# Patient Record
Sex: Male | Born: 1955
Health system: Southern US, Community
[De-identification: ages and names within clinical notes are randomized; demographics above are authoritative.]

## PROBLEM LIST (undated history)

## (undated) DIAGNOSIS — R350 Frequency of micturition: Secondary | ICD-10-CM

## (undated) DIAGNOSIS — I10 Essential (primary) hypertension: Secondary | ICD-10-CM

## (undated) DIAGNOSIS — R42 Dizziness and giddiness: Secondary | ICD-10-CM

## (undated) HISTORY — DX: Frequency of micturition: R35.0

## (undated) HISTORY — PX: HERNIA REPAIR: SHX51

## (undated) HISTORY — DX: Dizziness and giddiness: R42

---

## 2006-09-15 ENCOUNTER — Emergency Department (HOSPITAL_COMMUNITY): Admission: EM | Admit: 2006-09-15 | Discharge: 2006-09-15 | Payer: Self-pay | Admitting: Family Medicine

## 2007-01-09 ENCOUNTER — Emergency Department (HOSPITAL_COMMUNITY): Admission: EM | Admit: 2007-01-09 | Discharge: 2007-01-09 | Payer: Self-pay | Admitting: Family Medicine

## 2010-07-31 ENCOUNTER — Ambulatory Visit (HOSPITAL_COMMUNITY): Admission: RE | Admit: 2010-07-31 | Discharge: 2010-07-31 | Payer: Self-pay | Admitting: Gastroenterology

## 2015-01-27 ENCOUNTER — Emergency Department
Admission: EM | Admit: 2015-01-27 | Discharge: 2015-01-27 | Disposition: A | Discharge status: Home / Self Care | Payer: 59 | Source: Home / Self Care | Attending: Family Medicine | Admitting: Family Medicine

## 2015-01-27 DIAGNOSIS — H65191 Other acute nonsuppurative otitis media, right ear: Secondary | ICD-10-CM

## 2015-01-27 DIAGNOSIS — J069 Acute upper respiratory infection, unspecified: Secondary | ICD-10-CM | POA: Diagnosis not present

## 2015-01-27 LAB — POCT RAPID STREP A (OFFICE): RAPID STREP A SCREEN: NEGATIVE

## 2015-01-27 MED ORDER — AZITHROMYCIN 250 MG PO TABS: ORAL_TABLET | ORAL | Status: DC

## 2015-01-27 MED ORDER — BENZONATATE 200 MG PO CAPS: 200.0000 mg | ORAL_CAPSULE | Freq: Every day | ORAL | Status: DC

## 2015-01-27 NOTE — ED Provider Notes (Signed)
CSN: [id number]     Arrival date & time 01/27/15  1133 History   First MD Initiated Contact with Patient 01/27/15 1231     Chief Complaint  Patient presents with  . Sore Throat      HPI Comments: Patient complains of one week history of typical cold-like symptoms including mild sore throat, sinus congestion, fatigue, and cough. His ears feel full with decreased hearing on the right.  He states that he has had several year history of recurring disequilibrium, worse with present illness.  The history is provided by the patient and the spouse.    No past medical history on file. No past surgical history on file. No family history on file. History  Substance Use Topics  . Smoking status: Not on file  . Smokeless tobacco: Not on file  . Alcohol Use: Not on file    Review of Systems + sore throat + hoarseness + cough No pleuritic pain No wheezing + nasal congestion + post-nasal drainage No sinus pain/pressure No itchy/red eyes ? Earache and change in hearing + dizziness No hemoptysis No SOB + low grade fever, + chills No nausea No vomiting No abdominal pain No diarrhea No urinary symptoms No skin rash + fatigue No myalgias No headache Used OTC meds without relief  Allergies  Review of patient's allergies indicates no known allergies.  Home Medications   Prior to Admission medications   Medication Sig Start Date End Date Taking? Authorizing Provider  BENAZEPRIL HCL PO Take by mouth.   Yes Historical Provider, MD  azithromycin (ZITHROMAX Z-PAK) 250 MG tablet Take 2 tabs today; then begin one tab once daily for 4 more days. 01/27/15   Lattie Haw, MD  benzonatate (TESSALON) 200 MG capsule Take 1 capsule (200 mg total) by mouth at bedtime. Take as needed for cough 01/27/15   Lattie Haw, MD   BP 128/70 mmHg  Pulse 92  Temp(Src) 97.4 F (36.3 C) (Oral)  Wt 256 lb (116.121 kg)  SpO2 97% Physical Exam Nursing notes and Vital Signs reviewed. Appearance:   Patient appears stated age, and in no acute distress Eyes:  Pupils are equal, round, and reactive to light and accomodation.  Extraocular movement is intact.  Conjunctivae are not inflamed  Ears:  Canals normal.  Left tympanic membrane is normal.  Right tympanic membrane is scarred, retracted, and erythematousl  Nose:  Mildly congested turbinates.  No sinus tenderness.    Pharynx:  Normal Neck:  Supple.  Slightly tender enlarged posterior nodes are palpated bilaterally  Lungs:  Clear to auscultation.  Breath sounds are equal.  Heart:  Regular rate and rhythm without murmurs, rubs, or gallops.  Abdomen:  Nontender without masses or hepatosplenomegaly.  Bowel sounds are present.  No CVA or flank tenderness.  Extremities:  No edema.  No calf tenderness Skin:  No rash present.   ED Course  Procedures  None    Labs Reviewed  POCT RAPID STREP A (OFFICE) - Negative   Tympanogram:  Wide in left ear; "noisy" right ear.       MDM   1. Acute nonsuppurative otitis media of right ear   2. Acute upper respiratory infection    Begin Z-pack for atypical coverage.  Prescription written for Benzonatate [facility]) to take at bedtime for night-time cough.  Take plain guaifenesin (  extended release tabs such as Mucinex) twice daily, with plenty of water, for cough and congestion.   Get adequate rest.   May use Afrin  nasal spray (or generic oxymetazoline) twice daily for about 5 days.  Also recommend using saline nasal spray several times daily and saline nasal irrigation (AYR is a common brand)   Try warm salt water gargles for sore throat.  Stop all antihistamines for now, and other non-prescription cough/cold preparations.   Follow-up with family doctor if not improving about10 days.  Follow-up with ENT for evaluation of recurring disequilibrium and abnormal right tympanogram.   Lattie HawStephen A Merit Maybee, MD 01/27/15 [phone]

## 2015-01-27 NOTE — Discharge Instructions (Signed)
Take plain guaifenesin (1200mg  extended release tabs such as Mucinex) twice daily, with plenty of water, for cough and congestion.   Get adequate rest.   May use Afrin nasal spray (or generic oxymetazoline) twice daily for about 5 days.  Also recommend using saline nasal spray several times daily and saline nasal irrigation (AYR is a common brand)   Try warm salt water gargles for sore throat.  Stop all antihistamines for now, and other non-prescription cough/cold preparations.   Follow-up with family doctor if not improving about10 days.

## 2015-01-27 NOTE — ED Notes (Signed)
Sore throat, cough , congestion, facial pain and pressure, eye drainage 2-3 days

## 2016-02-28 MED FILL — BENAZEPRIL HCL 20 MG TABLET: 20 | 90 days supply | Qty: 90 | Fill #1

## 2016-03-19 DIAGNOSIS — H02834 Dermatochalasis of left upper eyelid: Secondary | ICD-10-CM | POA: Diagnosis not present

## 2016-03-19 DIAGNOSIS — H10523 Angular blepharoconjunctivitis, bilateral: Secondary | ICD-10-CM | POA: Diagnosis not present

## 2016-03-19 DIAGNOSIS — H02055 Trichiasis without entropian left lower eyelid: Secondary | ICD-10-CM | POA: Diagnosis not present

## 2016-03-19 DIAGNOSIS — H02831 Dermatochalasis of right upper eyelid: Secondary | ICD-10-CM | POA: Diagnosis not present

## 2016-03-19 DIAGNOSIS — H43393 Other vitreous opacities, bilateral: Secondary | ICD-10-CM | POA: Diagnosis not present

## 2016-03-20 DIAGNOSIS — Z0000 Encounter for general adult medical examination without abnormal findings: Secondary | ICD-10-CM | POA: Diagnosis not present

## 2016-03-26 DIAGNOSIS — Z0000 Encounter for general adult medical examination without abnormal findings: Secondary | ICD-10-CM | POA: Diagnosis not present

## 2016-05-26 ENCOUNTER — Ambulatory Visit (HOSPITAL_COMMUNITY)
Admission: EM | Admit: 2016-05-26 | Discharge: 2016-05-26 | Disposition: A | Discharge status: Home / Self Care | Payer: 59 | Attending: Family Medicine | Admitting: Family Medicine

## 2016-05-26 ENCOUNTER — Encounter (HOSPITAL_COMMUNITY): Payer: Self-pay | Admitting: Emergency Medicine

## 2016-05-26 DIAGNOSIS — H8110 Benign paroxysmal vertigo, unspecified ear: Secondary | ICD-10-CM | POA: Diagnosis not present

## 2016-05-26 HISTORY — DX: Essential (primary) hypertension: I10

## 2016-05-26 LAB — POCT I-STAT, CHEM 8
BUN: 22 mg/dL — ABNORMAL HIGH (ref 6–20)
CHLORIDE: 103 mmol/L (ref 101–111)
CREATININE: 1.1 mg/dL (ref 0.61–1.24)
Calcium, Ion: 1.1 mmol/L — ABNORMAL LOW (ref 1.13–1.30)
GLUCOSE: 97 mg/dL (ref 65–99)
HCT: 46 % (ref 39.0–52.0)
HEMOGLOBIN: 15.6 g/dL (ref 13.0–17.0)
POTASSIUM: 4.1 mmol/L (ref 3.5–5.1)
Sodium: 140 mmol/L (ref 135–145)
TCO2: 27 mmol/L (ref 0–100)

## 2016-05-26 MED ORDER — MECLIZINE HCL 25 MG PO TABS: 25.0000 mg | ORAL_TABLET | Freq: Three times a day (TID) | ORAL | Status: DC | PRN

## 2016-05-26 NOTE — ED Provider Notes (Signed)
CSN: [id number]     Arrival date & time 05/26/16  1458 History   First MD Initiated Contact with Patient 05/26/16 1539     Chief Complaint  Patient presents with  . Anxiety  . Dizziness   (Consider location/radiation/quality/duration/timing/severity/associated sxs/prior Treatment) Patient is a 60 y.o. male presenting with anxiety. The history is provided by the patient and the spouse.  Anxiety This is a new problem. The problem has been resolved. Pertinent negatives include no chest pain, no abdominal pain and no headaches.    Past Medical History  Diagnosis Date  . Hypertension    Past Surgical History  Procedure Laterality Date  . Hernia repair     Family History  Problem Relation Age of Onset  . Stroke Mother   . Hypertension Mother   . Heart failure Father    Social History  Substance Use Topics  . Smoking status: Never Smoker   . Smokeless tobacco: None  . Alcohol Use: Yes     Comment: occassional    Review of Systems  Constitutional: Negative.   HENT: Negative.  Negative for hearing loss.   Eyes: Negative.   Respiratory: Negative.   Cardiovascular: Negative.  Negative for chest pain.  Gastrointestinal: Negative for nausea, vomiting and abdominal pain.  Genitourinary: Negative.   Neurological: Positive for dizziness. Negative for facial asymmetry, light-headedness and headaches.  All other systems reviewed and are negative.   Allergies  Review of patient's allergies indicates no known allergies.  Home Medications   Prior to Admission medications   Medication Sig Start Date End Date Taking? Authorizing Provider  BENAZEPRIL HCL PO Take by mouth.   Yes Historical Provider, MD  azithromycin (ZITHROMAX Z-PAK) 250 MG tablet Take 2 tabs today; then begin one tab once daily for 4 more days. 01/27/15   Lattie HawStephen A Beese, MD  benzonatate (TESSALON) 200 MG capsule Take 1 capsule (200 mg total) by mouth at bedtime. Take as needed for cough 01/27/15   Lattie HawStephen A Beese, MD   meclizine (ANTIVERT) 25 MG tablet Take 1 tablet (25 mg total) by mouth 3 (three) times daily as needed for dizziness. 05/26/16   Linna HoffJames D Salome Cozby, MD   Meds Ordered and Administered this Visit  Medications - No data to display  BP 162/95 mmHg  Pulse 60  Temp(Src) 97.5 F (36.4 C) (Oral)  Resp 18  SpO2 100% No data found.   Physical Exam  Constitutional: He is oriented to person, place, and time. He appears well-developed and well-nourished. No distress.  Neck: Normal range of motion. Neck supple.  Cardiovascular: Normal rate, regular rhythm, normal heart sounds and intact distal pulses.   Pulmonary/Chest: Effort normal and breath sounds normal.  Abdominal: Soft. Bowel sounds are normal.  Lymphadenopathy:    He has no cervical adenopathy.  Neurological: He is alert and oriented to person, place, and time. No cranial nerve deficit. Coordination normal.  Skin: Skin is warm and dry.  Nursing note and vitals reviewed.   ED Course  Procedures (including critical care time)  Labs Review Labs Reviewed  POCT I-STAT, CHEM 8 - Abnormal; Notable for the following:    BUN 22 (*)    Calcium, Ion 1.10 (*)    All other components within normal limits    Imaging Review No results found.   Visual Acuity Review  Right Eye Distance:   Left Eye Distance:   Bilateral Distance:    Right Eye Near:   Left Eye Near:    Bilateral Near:  MDM   1. BPV (benign positional vertigo), unspecified laterality        Linna HoffJames D Ozzie Knobel, MD 05/26/16 [phone]

## 2016-05-26 NOTE — Discharge Instructions (Signed)
See dr bates for further eval, use medicine as prescribed.

## 2016-05-26 NOTE — ED Notes (Signed)
[facility] a complaint of a panic attack that occurred today and dizziness that occurs when he changes positions.

## 2016-06-04 DIAGNOSIS — R42 Dizziness and giddiness: Secondary | ICD-10-CM | POA: Diagnosis not present

## 2016-06-04 DIAGNOSIS — H8110 Benign paroxysmal vertigo, unspecified ear: Secondary | ICD-10-CM | POA: Diagnosis not present

## 2016-06-04 DIAGNOSIS — I10 Essential (primary) hypertension: Secondary | ICD-10-CM | POA: Diagnosis not present

## 2016-06-09 DIAGNOSIS — R42 Dizziness and giddiness: Secondary | ICD-10-CM | POA: Diagnosis not present

## 2016-06-09 MED FILL — AMLODIPINE BESYLATE 5 MG TA: 5 | 30 days supply | Qty: 30 | Fill #0

## 2016-07-07 MED FILL — AMLODIPINE BESYLATE 5 MG TA: 5 | 30 days supply | Qty: 30 | Fill #0

## 2016-08-08 MED FILL — AMLODIPINE BESYLATE 5 MG TA: 5 | 30 days supply | Qty: 30 | Fill #1

## 2016-09-09 MED FILL — AMLODIPINE BESYLATE 5 MG TA: 5 | 30 days supply | Qty: 30 | Fill #1

## 2016-09-22 DIAGNOSIS — I10 Essential (primary) hypertension: Secondary | ICD-10-CM | POA: Diagnosis not present

## 2016-10-15 MED FILL — AMLODIPINE BESYLATE 5 MG TA: 5 | 90 days supply | Qty: 90 | Fill #0

## 2017-02-05 MED FILL — AMLODIPINE BESYLATE 5 MG TA: 5 | 90 days supply | Qty: 90 | Fill #1

## 2017-03-11 DIAGNOSIS — I10 Essential (primary) hypertension: Secondary | ICD-10-CM | POA: Diagnosis not present

## 2017-03-11 DIAGNOSIS — R42 Dizziness and giddiness: Secondary | ICD-10-CM | POA: Diagnosis not present

## 2017-03-11 MED FILL — MECLIZINE 25 MG TABLET: 25 | 30 days supply | Qty: 30 | Fill #0

## 2017-05-27 MED FILL — AMLODIPINE BESYLATE 5 MG TA: 5 | 30 days supply | Qty: 30 | Fill #0

## 2017-07-08 MED FILL — AMLODIPINE BESYLATE 5 MG TA: 5 | 90 days supply | Qty: 90 | Fill #0

## 2017-09-10 DIAGNOSIS — R351 Nocturia: Secondary | ICD-10-CM | POA: Diagnosis not present

## 2017-09-10 DIAGNOSIS — E7800 Pure hypercholesterolemia, unspecified: Secondary | ICD-10-CM | POA: Diagnosis not present

## 2017-09-10 DIAGNOSIS — Z0001 Encounter for general adult medical examination with abnormal findings: Secondary | ICD-10-CM | POA: Diagnosis not present

## 2017-09-10 DIAGNOSIS — I10 Essential (primary) hypertension: Secondary | ICD-10-CM | POA: Diagnosis not present

## 2017-09-10 MED FILL — TAMSULOSIN HCL 0.4 MG CAP: 0.4 | 30 days supply | Qty: 30 | Fill #0

## 2017-10-20 MED FILL — TAMSULOSIN HCL 0.4 MG CAP: 0.4 | 30 days supply | Qty: 30 | Fill #1

## 2017-10-20 MED FILL — AMLODIPINE BESYLATE 5 MG TA: 5 | 90 days supply | Qty: 90 | Fill #1

## 2017-12-30 MED FILL — TAMSULOSIN HCL 0.4 MG CAP: 0.4 | 30 days supply | Qty: 30 | Fill #2

## 2018-02-01 MED FILL — AMLODIPINE BESYLATE 5 MG TA: 5 | 90 days supply | Qty: 90 | Fill #0

## 2018-02-18 MED FILL — TAMSULOSIN HCL 0.4 MG CAP: 0.4 | 90 days supply | Qty: 90 | Fill #0

## 2018-03-18 ENCOUNTER — Encounter (INDEPENDENT_AMBULATORY_CARE_PROVIDER_SITE_OTHER): Payer: Self-pay

## 2018-04-05 ENCOUNTER — Encounter (INDEPENDENT_AMBULATORY_CARE_PROVIDER_SITE_OTHER): Payer: Self-pay | Admitting: Family Medicine

## 2018-04-05 ENCOUNTER — Ambulatory Visit (INDEPENDENT_AMBULATORY_CARE_PROVIDER_SITE_OTHER): Payer: 59 | Admitting: Family Medicine

## 2018-04-05 VITALS — BP 124/77 | HR 65 | Temp 97.5°F | Ht 73.0 in | Wt 243.0 lb

## 2018-04-05 DIAGNOSIS — Z9189 Other specified personal risk factors, not elsewhere classified: Secondary | ICD-10-CM | POA: Diagnosis not present

## 2018-04-05 DIAGNOSIS — E66811 Obesity, class 1: Secondary | ICD-10-CM

## 2018-04-05 DIAGNOSIS — Z0289 Encounter for other administrative examinations: Secondary | ICD-10-CM

## 2018-04-05 DIAGNOSIS — R5383 Other fatigue: Secondary | ICD-10-CM

## 2018-04-05 DIAGNOSIS — Z1331 Encounter for screening for depression: Secondary | ICD-10-CM

## 2018-04-05 DIAGNOSIS — Z6832 Body mass index (BMI) 32.0-32.9, adult: Secondary | ICD-10-CM | POA: Diagnosis not present

## 2018-04-05 DIAGNOSIS — I10 Essential (primary) hypertension: Secondary | ICD-10-CM | POA: Diagnosis not present

## 2018-04-05 DIAGNOSIS — E669 Obesity, unspecified: Secondary | ICD-10-CM | POA: Diagnosis not present

## 2018-04-05 DIAGNOSIS — R7303 Prediabetes: Secondary | ICD-10-CM | POA: Diagnosis not present

## 2018-04-06 LAB — CBC WITH DIFFERENTIAL
BASOS ABS: 0.1 10*3/uL (ref 0.0–0.2)
Basos: 1 %
EOS (ABSOLUTE): 0.1 10*3/uL (ref 0.0–0.4)
Eos: 1 %
Hematocrit: 47.7 % (ref 37.5–51.0)
Hemoglobin: 15.6 g/dL (ref 13.0–17.7)
Immature Grans (Abs): 0 10*3/uL (ref 0.0–0.1)
Immature Granulocytes: 0 %
LYMPHS ABS: 1.3 10*3/uL (ref 0.7–3.1)
Lymphs: 19 %
MCH: 29.8 pg (ref 26.6–33.0)
MCHC: 32.7 g/dL (ref 31.5–35.7)
MCV: 91 fL (ref 79–97)
MONOS ABS: 0.6 10*3/uL (ref 0.1–0.9)
Monocytes: 9 %
NEUTROS ABS: 4.6 10*3/uL (ref 1.4–7.0)
NEUTROS PCT: 70 %
RBC: 5.24 x10E6/uL (ref 4.14–5.80)
RDW: 13.3 % (ref 12.3–15.4)
WBC: 6.5 10*3/uL (ref 3.4–10.8)

## 2018-04-06 LAB — HEMOGLOBIN A1C
Est. average glucose Bld gHb Est-mCnc: 108 mg/dL
HEMOGLOBIN A1C: 5.4 % (ref 4.8–5.6)

## 2018-04-06 LAB — COMPREHENSIVE METABOLIC PANEL
A/G RATIO: 2 (ref 1.2–2.2)
ALT: 12 IU/L (ref 0–44)
AST: 21 IU/L (ref 0–40)
Albumin: 4.3 g/dL (ref 3.6–4.8)
Alkaline Phosphatase: 65 IU/L (ref 39–117)
BUN / CREAT RATIO: 13 (ref 10–24)
BUN: 14 mg/dL (ref 8–27)
Bilirubin Total: 0.4 mg/dL (ref 0.0–1.2)
CALCIUM: 8.9 mg/dL (ref 8.6–10.2)
CO2: 22 mmol/L (ref 20–29)
Chloride: 102 mmol/L (ref 96–106)
Creatinine, Ser: 1.07 mg/dL (ref 0.76–1.27)
GFR, EST AFRICAN AMERICAN: 86 mL/min/{1.73_m2} (ref >59)
GFR, EST NON AFRICAN AMERICAN: 74 mL/min/{1.73_m2} (ref >59)
GLOBULIN, TOTAL: 2.1 g/dL (ref 1.5–4.5)
Glucose: 97 mg/dL (ref 65–99)
POTASSIUM: 4.2 mmol/L (ref 3.5–5.2)
SODIUM: 139 mmol/L (ref 134–144)
TOTAL PROTEIN: 6.4 g/dL (ref 6.0–8.5)

## 2018-04-06 LAB — LIPID PANEL WITH LDL/HDL RATIO
CHOLESTEROL TOTAL: 176 mg/dL (ref 100–199)
HDL: 53 mg/dL (ref >39)
LDL CALC: 104 mg/dL — AB (ref 0–99)
LDL/HDL RATIO: 2 ratio (ref 0.0–3.6)
TRIGLYCERIDES: 95 mg/dL (ref 0–149)
VLDL CHOLESTEROL CAL: 19 mg/dL (ref 5–40)

## 2018-04-06 LAB — T4, FREE: FREE T4: 1.63 ng/dL (ref 0.82–1.77)

## 2018-04-06 LAB — T3: T3, Total: 80 ng/dL (ref 71–180)

## 2018-04-06 LAB — VITAMIN D 25 HYDROXY (VIT D DEFICIENCY, FRACTURES): Vit D, 25-Hydroxy: 36.9 ng/mL (ref 30.0–100.0)

## 2018-04-06 LAB — VITAMIN B12: Vitamin B-12: 646 pg/mL (ref 232–1245)

## 2018-04-06 LAB — FOLATE

## 2018-04-06 LAB — INSULIN, RANDOM: INSULIN: 9.7 u[IU]/mL (ref 2.6–24.9)

## 2018-04-06 LAB — TSH: TSH: 1.14 u[IU]/mL (ref 0.450–4.500)

## 2018-04-12 NOTE — Progress Notes (Signed)
.  Office: [phone]  /  Fax: [phone]   HPI:   Chief Complaint: OBESITY  Travis Meyer (MR# [id number]) is a 62 y.o. male who presents on 04/05/2018 for obesity evaluation and treatment. Current BMI is Body mass index is 32.06 kg/m.[name] Travis Meyer has struggled with obesity for years and has been unsuccessful in either losing weight or maintaining long term weight loss. Travis Meyer attended our information session and states he is currently in the action stage of change and ready to dedicate time achieving and maintaining a healthier weight.  Travis Meyer hears about our clinic from his wife. Significant snacking throughout the day.   Travis Meyer states his family eats meals together he thinks his family will eat healthier with  him he struggles with family and or coworkers weight loss sabotage his desired weight loss is 49 lbs he started gaining weight around age 7 his heaviest weight ever was 260 lbs he is a picky eater and doesn't like to eat healthier foods  he has significant food cravings issues  he snacks frequently in the evenings he is trying to eat vegetarian he is trying to eat vegan he is frequently drinking liquids with calories he frequently makes poor food choices he frequently eats larger portions than normal  he struggles with emotional eating    Fatigue Travis Meyer feels his energy is lower than it should Meyer. This has worsened with weight gain and has not worsened recently. Travis Meyer admits to daytime somnolence and  denies waking up still tired. Travis Meyer is at risk for obstructive sleep apnea. Patent has a history of symptoms of daytime fatigue. Travis Meyer generally gets 6 hours of sleep per night, and states they generally have generally restful sleep. Snoring is present. Apneic episodes are not present. Epworth Sleepiness Score is 5. EKG within normal limits.  Pre-Diabetes Travis Meyer has a diagnosis of pre-diabetes based on his elevated Hgb A1c and was informed this puts him at greater risk of developing  diabetes. He was told this was a diagnosis years ago. He is not taking metformin currently and continues to work on diet and exercise to decrease risk of diabetes. He denies nausea or hypoglycemia.  Hypertension Travis Meyer is a 62 y.o. male with hypertension. Travis Meyer denies chest pain, chest pressure, or headache. He is working weight loss to help control his blood pressure with the goal of decreasing his risk of heart attack and stroke. Travis Meyer's blood pressure is currently controlled.  At risk for cardiovascular disease Travis Meyer is at a higher than average risk for cardiovascular disease due to obesity and hypertension. He currently denies any chest pain.  Depression Screen Travis Meyer's Food and Mood (modified PHQ-9) score was  Depression screen PHQ 2/9 04/05/2018  Decreased Interest 1  Down, Depressed, Hopeless 0  PHQ - 2 Score 1  Altered sleeping 0  Tired, decreased energy 1  Change in appetite 0  Feeling bad or failure about yourself  0  Trouble concentrating 1  Moving slowly or fidgety/restless 0  Suicidal thoughts 0  PHQ-9 Score 3  Difficult doing work/chores Not difficult at all    ALLERGIES: No Known Allergies  MEDICATIONS: Current Outpatient Medications on File Prior to Visit  Medication Sig Dispense Refill  . amLODipine (NORVASC) 5 MG tablet Take 5 mg by mouth daily.    . Multiple Vitamin (MULTIVITAMIN) tablet Take 1 tablet by mouth daily.    . Omega-3 Fatty Acids (FISH OIL) 1000 MG CAPS Take by mouth daily.    . tamsulosin (FLOMAX) 0.4  MG CAPS capsule Take 0.4 mg by mouth.    . vitamin C (ASCORBIC ACID) 500 MG tablet Take 500 mg by mouth daily.    . vitamin E 100 UNIT capsule Take by mouth daily.    [name] zinc sulfate 220 (50 Zn) MG capsule Take 220 mg by mouth daily.     No current facility-administered medications on file prior to visit.     PAST MEDICAL HISTORY: Past Medical History:  Diagnosis Date  . Dizziness   . Frequent urination   . Hypertension     PAST SURGICAL  HISTORY: Past Surgical History:  Procedure Laterality Date  . HERNIA REPAIR      SOCIAL HISTORY: Social History   Tobacco Use  . Smoking status: Never Smoker  . Smokeless tobacco: Never Used  Substance Use Topics  . Alcohol use: Yes    Comment: occassional  . Drug use: Never    FAMILY HISTORY: Family History  Problem Relation Age of Onset  . Stroke Mother   . Hypertension Mother   . Heart failure Father   . Heart disease Father   . Sudden death Father   . Alcoholism Father   . Obesity Father     ROS: Review of Systems  Constitutional: Positive for malaise/fatigue. Negative for weight loss.  Eyes:       + Wear glasses or contacts  Cardiovascular: Negative for chest pain.       Negative chest pressure  Gastrointestinal: Negative for nausea.  Genitourinary: Positive for frequency.  Neurological: Positive for dizziness. Negative for headaches.  Endo/Heme/Allergies:       Negative hypoglycemia    PHYSICAL EXAM: Blood pressure 124/77, pulse 65, temperature (!) 97.5 F (36.4 C), temperature source Oral, height  (1.854 m), weight 243 lb (110.2 kg), SpO2 98 %. Body mass index is 32.06 kg/m. Physical Exam  Constitutional: He is oriented to person, place, and time. He appears well-developed and well-nourished.  HENT:  Head: Normocephalic and atraumatic.  Eyes: EOM are normal. No scleral icterus.  Neck: Normal range of motion. Neck supple. No thyromegaly present.  Cardiovascular: Normal rate and regular rhythm.  Pulmonary/Chest: Effort normal. No respiratory distress.  Abdominal: Soft. There is no tenderness.  + Obesity  Musculoskeletal:  Range of Motion normal in all 4 extremities Trace edema noted in bilateral lower extremities  Neurological: He is alert and oriented to person, place, and time. Coordination normal.  Skin: Skin is warm and dry.  Psychiatric: He has a normal mood and affect. His behavior is normal.  Vitals reviewed.   RECENT LABS AND  TESTS: BMET    Component Value Date/Time   NA 139 04/05/2018 1118   K 4.2 04/05/2018 1118   CL 102 04/05/2018 1118   CO2 22 04/05/2018 1118   GLUCOSE 97 04/05/2018 1118   GLUCOSE 97 05/26/2016 1630   BUN 14 04/05/2018 1118   CREATININE 1.07 04/05/2018 1118   CALCIUM 8.9 04/05/2018 1118   GFRNONAA 74 04/05/2018 1118   GFRAA 86 04/05/2018 1118   Lab Results  Component Value Date   HGBA1C 5.4 04/05/2018   Lab Results  Component Value Date   INSULIN 9.7 04/05/2018   CBC    Component Value Date/Time   WBC 6.5 04/05/2018 1118   RBC 5.24 04/05/2018 1118   HGB 15.6 04/05/2018 1118   HCT 47.7 04/05/2018 1118   MCV 91 04/05/2018 1118   MCH 29.8 04/05/2018 1118   MCHC 32.7 04/05/2018 1118   RDW 13.3  04/05/2018 1118   LYMPHSABS 1.3 04/05/2018 1118   EOSABS 0.1 04/05/2018 1118   BASOSABS 0.1 04/05/2018 1118   Iron/TIBC/Ferritin/ %Sat No results found for: IRON, TIBC, FERRITIN, IRONPCTSAT Lipid Panel     Component Value Date/Time   CHOL 176 04/05/2018 1118   TRIG 95 04/05/2018 1118   HDL 53 04/05/2018 1118   LDLCALC 104 (H) 04/05/2018 1118   Hepatic Function Panel     Component Value Date/Time   PROT 6.4 04/05/2018 1118   ALBUMIN 4.3 04/05/2018 1118   AST 21 04/05/2018 1118   ALT 12 04/05/2018 1118   ALKPHOS 65 04/05/2018 1118   BILITOT 0.4 04/05/2018 1118      Component Value Date/Time   TSH 1.140 04/05/2018 1118   Vitamin D No recent labs  ECG  shows NSR with a rate of 69 BPM INDIRECT CALORIMETER done today shows a VO2 of 301 and a REE of 2096. His calculated basal metabolic rate is 1610 thus his basal metabolic rate is worse than expected.    ASSESSMENT AND PLAN: Other fatigue - Plan: EKG 12-Lead, Vitamin B12, Folate, T3, T4, free, TSH, VITAMIN D 25 Hydroxy (Vit-D Deficiency, Fractures)  Shortness of breath on exertion  Prediabetes - Plan: Hemoglobin A1c, Insulin, random  Essential hypertension - Plan: Comprehensive metabolic panel, CBC With  Differential, Lipid Panel With LDL/HDL Ratio  Depression screening  At risk for heart disease  Class 1 obesity with serious comorbidity and body mass index (BMI) of 32.0 to 32.9 in adult, unspecified obesity type  PLAN:  Fatigue Travis Meyer was informed that his fatigue may Meyer related to obesity, depression or many other causes. Labs will Meyer ordered, and in the meanwhile Travis Meyer has agreed to work on diet, exercise and weight loss to help with fatigue. Proper sleep hygiene was discussed including the need for 7-8 hours of quality sleep each night. A sleep study was not ordered based on symptoms and Epworth score.  Pre-Diabetes Braylyn will continue to work on weight loss, exercise, and decreasing simple carbohydrates in his diet to help decrease the risk of diabetes. We dicussed metformin including benefits and risks. He was informed that eating too many simple carbohydrates or too many calories at one sitting increases the likelihood of GI side effects. Leonardo declined metformin for now and a prescription was not written today. We will check labs and Travis Meyer agrees to follow up with our clinic in 2 weeks as directed to monitor his progress.  Hypertension We discussed sodium restriction, working on healthy weight loss, and a regular exercise program as the means to achieve improved blood pressure control. Travis Meyer agreed with this plan and agreed to follow up as directed. We will continue to monitor his blood pressure as well as his progress with the above lifestyle modifications. He will continue amlodipine and will watch for signs of hypotension as he continues his lifestyle modifications. We will check labs and Travis Meyer agrees to follow up with our clinic in 2 weeks.  Cardiovascular risk counselling Travis Meyer was given extended (15 minutes) coronary artery disease prevention counseling today. He is 62 y.o. male and has risk factors for heart disease including obesity and hypertension. We discussed intensive lifestyle  modifications today with an emphasis on specific weight loss instructions and strategies. Pt was also informed of the importance of increasing exercise and decreasing saturated fats to help prevent heart disease.  Depression Screen Travis Meyer had a negative depression screening. Depression is commonly associated with obesity and often results in emotional eating  behaviors. We will monitor this closely and work on CBT to help improve the non-hunger eating patterns. Referral to Psychology may Meyer required if no improvement is seen as he continues in our clinic.  Obesity Travis Meyer is currently in the action stage of change and his goal is to continue with weight loss efforts He has agreed to follow the Category 3 plan Travis Meyer has been instructed to work up to a goal of 150 minutes of combined cardio and strengthening exercise per week for weight loss and overall health benefits. We discussed the following Behavioral Modification Strategies today: increasing lean protein intake, increasing vegetables, work on meal planning and easy cooking plans, and better snacking choices  Travis Meyer has agreed to follow up with our clinic in 2 weeks. He was informed of the importance of frequent follow up visits to maximize his success with intensive lifestyle modifications for his multiple health conditions. He was informed we would discuss his lab results at his next visit unless there is a critical issue that needs to Meyer addressed sooner. [place] agreed to keep his next visit at the agreed upon time to discuss these results.    OBESITY BEHAVIORAL INTERVENTION VISIT  Today's visit was # 1 out of 22.  Starting weight: 243 lbs Starting date: 04/05/18 Today's weight : 243 lbs Today's date: 04/05/2018 Total lbs lost to date: 0 (Patients must lose 7 lbs in the first 6 months to continue with counseling)   ASK: We discussed the diagnosis of obesity with Travis Beets today and Quaid agreed to give [place] permission to discuss obesity  behavioral modification therapy today.  ASSESS: Milus has the diagnosis of obesity and his BMI today is 32.07 Verne is in the action stage of change   ADVISE: Kee was educated on the multiple health risks of obesity as well as the benefit of weight loss to improve his health. He was advised of the need for long term treatment and the importance of lifestyle modifications.  AGREE: Multiple dietary modification options and treatment options were discussed and  Dragan agreed to the above obesity treatment plan.   I, Burt Knack, am acting as transcriptionist for Debbra Riding, MD   I have reviewed the above documentation for accuracy and completeness, and I agree with the above. - Debbra Riding, MD

## 2018-04-14 ENCOUNTER — Encounter (INDEPENDENT_AMBULATORY_CARE_PROVIDER_SITE_OTHER): Payer: Self-pay | Admitting: Family Medicine

## 2018-04-15 ENCOUNTER — Encounter (INDEPENDENT_AMBULATORY_CARE_PROVIDER_SITE_OTHER): Payer: Self-pay | Admitting: Family Medicine

## 2018-04-19 ENCOUNTER — Ambulatory Visit (INDEPENDENT_AMBULATORY_CARE_PROVIDER_SITE_OTHER): Payer: 59 | Admitting: Family Medicine

## 2018-04-19 VITALS — BP 123/75 | HR 71 | Temp 97.9°F | Ht 73.0 in | Wt 239.0 lb

## 2018-04-19 DIAGNOSIS — E8881 Metabolic syndrome: Secondary | ICD-10-CM

## 2018-04-19 DIAGNOSIS — E559 Vitamin D deficiency, unspecified: Secondary | ICD-10-CM | POA: Diagnosis not present

## 2018-04-19 DIAGNOSIS — Z9189 Other specified personal risk factors, not elsewhere classified: Secondary | ICD-10-CM

## 2018-04-19 DIAGNOSIS — Z6831 Body mass index (BMI) 31.0-31.9, adult: Secondary | ICD-10-CM | POA: Diagnosis not present

## 2018-04-19 DIAGNOSIS — E669 Obesity, unspecified: Secondary | ICD-10-CM | POA: Diagnosis not present

## 2018-04-19 MED ORDER — VITAMIN D (ERGOCALCIFEROL) 1.25 MG (50000 UNIT) PO CAPS: 50000.0000 [IU] | ORAL_CAPSULE | ORAL | 0 refills | Status: DC

## 2018-04-19 MED FILL — VIT D2 1.25 MG (50,000 UNIT: 1.25 MG | 28 days supply | Qty: 4 | Fill #0

## 2018-04-19 NOTE — Progress Notes (Signed)
Office: [phone]  /  Fax: [phone]   HPI:   Chief Complaint: OBESITY Travis Meyer is here to discuss his progress with his obesity treatment plan. He is on the Category 3 plan and is following his eating plan approximately 90 % of the time. He states he is doing cardio for 60 minutes 3 to 4 times per week. Travis Meyer is looking for more vegetarian options. Travis Meyer feels it is too much meat at dinner and he struggled to get all the meat in at dinner for the past couple of days. Travis Meyer denies hunger. He may not have gotten all his snacks in. His weight is 239 lb (108.4 kg) today and has had a weight loss of 4 pounds over a period of 2 weeks since his last visit. He has lost 4 lbs since starting treatment with [place].  Vitamin D deficiency Travis Meyer has a diagnosis of vitamin D deficiency. Travis Meyer is not currently taking vit D and he admits fatigue but denies nausea, vomiting or muscle weakness.  Insulin Resistance Travis Meyer has a diagnosis of insulin resistance based on his elevated fasting insulin level of 9.7. Although Travis Meyer's blood glucose readings are still under good control, insulin resistance puts him at greater risk of metabolic syndrome and diabetes. He is not taking metformin currently and continues to work on diet and exercise to decrease risk of diabetes. Travis Meyer denies carb cravings.  At risk for diabetes Travis Meyer is at higher than average risk for developing diabetes due to his obesity and insulin resistance. He currently denies polyuria or polydipsia.  ALLERGIES: No Known Allergies  MEDICATIONS: Current Outpatient Medications on File Prior to Visit  Medication Sig Dispense Refill  . amLODipine (NORVASC) 5 MG tablet Take 5 mg by mouth daily.    . Multiple Vitamin (MULTIVITAMIN) tablet Take 1 tablet by mouth daily.    . Omega-3 Fatty Acids (FISH OIL) 1000 MG CAPS Take by mouth daily.    . tamsulosin (FLOMAX) 0.4 MG CAPS capsule Take 0.4 mg by mouth.    . vitamin C (ASCORBIC ACID) 500 MG tablet Take 500 mg by  mouth daily.    . vitamin E 100 UNIT capsule Take by mouth daily.    Travis Meyer Kitchen zinc sulfate 220 (50 Zn) MG capsule Take 220 mg by mouth daily.     No current facility-administered medications on file prior to visit.     PAST MEDICAL HISTORY: Past Medical History:  Diagnosis Date  . Dizziness   . Frequent urination   . Hypertension     PAST SURGICAL HISTORY: Past Surgical History:  Procedure Laterality Date  . HERNIA REPAIR      SOCIAL HISTORY: Social History   Tobacco Use  . Smoking status: Never Smoker  . Smokeless tobacco: Never Used  Substance Use Topics  . Alcohol use: Yes    Comment: occassional  . Drug use: Never    FAMILY HISTORY: Family History  Problem Relation Age of Onset  . Stroke Mother   . Hypertension Mother   . Heart failure Father   . Heart disease Father   . Sudden death Father   . Alcoholism Father   . Obesity Father     ROS: Review of Systems  Constitutional: Positive for malaise/fatigue and weight loss.  Gastrointestinal: Negative for nausea and vomiting.  Genitourinary: Negative for frequency.  Musculoskeletal:       Negative for muscle weakness  Endo/Heme/Allergies: Negative for polydipsia.       Negative for carb cravings  PHYSICAL EXAM: Blood pressure 123/75, pulse 71, temperature 97.9 F (36.6 C), temperature source Oral, height  (1.854 m), weight 239 lb (108.4 kg), SpO2 97 %. Body mass index is 31.53 kg/m. Physical Exam  Constitutional: He is oriented to person, place, and time. He appears well-developed and well-nourished.  Cardiovascular: Normal rate.  Pulmonary/Chest: Effort normal.  Musculoskeletal: Normal range of motion.  Neurological: He is oriented to person, place, and time.  Skin: Skin is warm and dry.  Psychiatric: He has a normal mood and affect. His behavior is normal.  Vitals reviewed.   RECENT LABS AND TESTS: BMET    Component Value Date/Time   NA 139 04/05/2018 1118   K 4.2 04/05/2018 1118   CL 102  04/05/2018 1118   CO2 22 04/05/2018 1118   GLUCOSE 97 04/05/2018 1118   GLUCOSE 97 05/26/2016 1630   BUN 14 04/05/2018 1118   CREATININE 1.07 04/05/2018 1118   CALCIUM 8.9 04/05/2018 1118   GFRNONAA 74 04/05/2018 1118   GFRAA 86 04/05/2018 1118   Lab Results  Component Value Date   HGBA1C 5.4 04/05/2018   Lab Results  Component Value Date   INSULIN 9.7 04/05/2018   CBC    Component Value Date/Time   WBC 6.5 04/05/2018 1118   RBC 5.24 04/05/2018 1118   HGB 15.6 04/05/2018 1118   HCT 47.7 04/05/2018 1118   MCV 91 04/05/2018 1118   MCH 29.8 04/05/2018 1118   MCHC 32.7 04/05/2018 1118   RDW 13.3 04/05/2018 1118   LYMPHSABS 1.3 04/05/2018 1118   EOSABS 0.1 04/05/2018 1118   BASOSABS 0.1 04/05/2018 1118   Iron/TIBC/Ferritin/ %Sat No results found for: IRON, TIBC, FERRITIN, IRONPCTSAT Lipid Panel     Component Value Date/Time   CHOL 176 04/05/2018 1118   TRIG 95 04/05/2018 1118   HDL 53 04/05/2018 1118   LDLCALC 104 (H) 04/05/2018 1118   Hepatic Function Panel     Component Value Date/Time   PROT 6.4 04/05/2018 1118   ALBUMIN 4.3 04/05/2018 1118   AST 21 04/05/2018 1118   ALT 12 04/05/2018 1118   ALKPHOS 65 04/05/2018 1118   BILITOT 0.4 04/05/2018 1118      Component Value Date/Time   TSH 1.140 04/05/2018 1118   Results for Aina, Cal L (MRN [id number]) as of 04/19/2018 17:32  Ref. Range 04/05/2018 11:18  Vitamin D, 25-Hydroxy Latest Ref Range: 30.0 - 100.0 ng/mL 36.9   ASSESSMENT AND PLAN: Vitamin D deficiency - Plan: Vitamin D, Ergocalciferol, (DRISDOL) 50000 units CAPS capsule  Insulin resistance  At risk for diabetes mellitus  Class 1 obesity with serious comorbidity and body mass index (BMI) of 31.0 to 31.9 in adult, unspecified obesity type  PLAN:  Vitamin D Deficiency Travis Meyer was informed that low vitamin D levels contributes to fatigue and are associated with obesity, breast, and colon cancer. He agrees to continue to take prescription Vit D ,000  IU every week #4 with no refills and will follow up for routine testing of vitamin D, at least 2-3 times per year. He was informed of the risk of over-replacement of vitamin D and agrees to not increase his dose unless he discusses this with [place] first. Travis Meyer agrees to follow up as directed.  Insulin Resistance Travis Meyer will continue to work on weight loss, exercise, and decreasing simple carbohydrates in his diet to help decrease the risk of diabetes. He was informed that eating too many simple carbohydrates or too many calories at one sitting increases  the likelihood of GI side effects. We will recheck labs in 3 months and Travis Meyer agreed to follow up with [place] as directed to monitor his progress.  Diabetes risk counseling Travis Meyer was given extended (30 minutes) diabetes prevention counseling today. He is 62 y.o. male and has risk factors for diabetes including obesity and insulin resistance. We discussed intensive lifestyle modifications today with an emphasis on weight loss as well as increasing exercise and decreasing simple carbohydrates in his diet.  Obesity Travis Meyer is currently in the action stage of change. As such, his goal is to continue with weight loss efforts He has agreed to follow the Pescatarian eating plan +300 calories Travis Meyer has been instructed to work up to a goal of 150 minutes of combined cardio and strengthening exercise per week for weight loss and overall health benefits. We discussed the following Behavioral Modification Strategies today: increase H2O intake, better snacking choices, planning for success, increasing lean protein intake, increasing vegetables and work on meal planning and easy cooking plans  Gardiner has agreed to follow up with our clinic in 2 to 3 weeks. He was informed of the importance of frequent follow up visits to maximize his success with intensive lifestyle modifications for his multiple health conditions.   OBESITY BEHAVIORAL INTERVENTION VISIT  Today's visit was # 2  out of 22.  Starting weight: 243 lbs Starting date: 04/05/18 Today's weight : 239 lbs Today's date: 04/19/2018 Total lbs lost to date: 4 (Patients must lose 7 lbs in the first 6 months to continue with counseling)   ASK: We discussed the diagnosis of obesity with Travis Meyer today and Travis Meyer agreed to give [place] permission to discuss obesity behavioral modification therapy today.  ASSESS: Travis Meyer has the diagnosis of obesity and his BMI today is .67 Travis Meyer is in the action stage of change   ADVISE: Zyshonne was educated on the multiple health risks of obesity as well as the benefit of weight loss to improve his health. He was advised of the need for long term treatment and the importance of lifestyle modifications.  AGREE: Multiple dietary modification options and treatment options were discussed and  Miachel agreed to the above obesity treatment plan.  I, [place] Crane, am acting as transcriptionist for Filbert Schilder, MD  I have reviewed the above documentation for accuracy and completeness, and I agree with the above. - Debbra Riding, MD

## 2018-05-12 ENCOUNTER — Ambulatory Visit (INDEPENDENT_AMBULATORY_CARE_PROVIDER_SITE_OTHER): Payer: 59 | Admitting: Family Medicine

## 2018-05-12 VITALS — BP 113/71 | HR 67 | Temp 97.9°F | Ht 73.0 in | Wt 237.0 lb

## 2018-05-12 DIAGNOSIS — E559 Vitamin D deficiency, unspecified: Secondary | ICD-10-CM

## 2018-05-12 DIAGNOSIS — Z6831 Body mass index (BMI) 31.0-31.9, adult: Secondary | ICD-10-CM

## 2018-05-12 DIAGNOSIS — Z9189 Other specified personal risk factors, not elsewhere classified: Secondary | ICD-10-CM | POA: Diagnosis not present

## 2018-05-12 DIAGNOSIS — I10 Essential (primary) hypertension: Secondary | ICD-10-CM | POA: Diagnosis not present

## 2018-05-12 DIAGNOSIS — K5909 Other constipation: Secondary | ICD-10-CM

## 2018-05-12 DIAGNOSIS — E669 Obesity, unspecified: Secondary | ICD-10-CM | POA: Diagnosis not present

## 2018-05-12 MED ORDER — VITAMIN D (ERGOCALCIFEROL) 1.25 MG (50000 UNIT) PO CAPS: 50000.0000 [IU] | ORAL_CAPSULE | ORAL | 0 refills | Status: DC

## 2018-05-12 MED FILL — VIT D2 1.25 MG (50,000 UNIT: 1.25 MG | 28 days supply | Qty: 4 | Fill #0

## 2018-05-12 NOTE — Progress Notes (Signed)
Office: [phone]  /  Fax: [phone]   HPI:   Chief Complaint: OBESITY Travis Meyer is here to discuss his progress with his obesity treatment plan. He is on the Pescatarian eating plan + 300 calories and is following his eating plan approximately 85 % of the time. He states he is exercising on the treadmill for 60 minutes 2 to 3 times per week. [name] finds himself to be eating much more meat than he is used to. Hunger is controlled. His weight is 237 lb (107.5 kg) today and has had a weight loss of 2 pounds over a period of 3 weeks since his last visit. He has lost 6 lbs since starting treatment with [place].  Vitamin D deficiency Travis Meyer has a diagnosis of vitamin D deficiency. Travis Meyer is currently taking vit D. He admits fatigue and denies nausea, vomiting or muscle weakness.  At risk for osteopenia and osteoporosis Travis Meyer is at higher risk of osteopenia and osteoporosis due to vitamin D deficiency.   Hypertension Travis Meyer is a 62 y.o. male with hypertension. Travis Meyer denies chest pain, chest pressure or headache. He is working weight loss to help control his blood pressure with the goal of decreasing his risk of heart attack and stroke. [name] blood pressure is well controlled today.  Constipation Travis Meyer notes constipation for the last few weeks, worse since attempting weight loss. He states BM are less frequent and are not hard and painful. He denies hematochezia or melena. He  Started taking Metamucil.   ALLERGIES: No Known Allergies  MEDICATIONS: Current Outpatient Medications on File Prior to Visit  Medication Sig Dispense Refill  . amLODipine (NORVASC) 5 MG tablet Take 5 mg by mouth as directed. Take 1/2 tab daily    . Multiple Vitamin (MULTIVITAMIN) tablet Take 1 tablet by mouth daily.    . Omega-3 Fatty Acids (FISH OIL) 1000 MG CAPS Take by mouth daily.    . tamsulosin (FLOMAX) 0.4 MG CAPS capsule Take 0.4 mg by mouth.    . vitamin C (ASCORBIC ACID) 500 MG tablet Take 500 mg by  mouth daily.    . vitamin E 100 UNIT capsule Take by mouth daily.    [name] zinc sulfate 220 (50 Zn) MG capsule Take 220 mg by mouth daily.     No current facility-administered medications on file prior to visit.     PAST MEDICAL HISTORY: Past Medical History:  Diagnosis Date  . Dizziness   . Frequent urination   . Hypertension     PAST SURGICAL HISTORY: Past Surgical History:  Procedure Laterality Date  . HERNIA REPAIR      SOCIAL HISTORY: Social History   Tobacco Use  . Smoking status: Never Smoker  . Smokeless tobacco: Never Used  Substance Use Topics  . Alcohol use: Yes    Comment: occassional  . Drug use: Never    FAMILY HISTORY: Family History  Problem Relation Age of Onset  . Stroke Mother   . Hypertension Mother   . Heart failure Father   . Heart disease Father   . Sudden death Father   . Alcoholism Father   . Obesity Father     ROS: Review of Systems  Constitutional: Positive for malaise/fatigue and weight loss.  Cardiovascular: Negative for chest pain.       Negative for chest pressure  Gastrointestinal: Positive for constipation. Negative for melena, nausea and vomiting.       Negative for hematochezia  Musculoskeletal:  Negative for muscle weakness  Neurological: Negative for headaches.    PHYSICAL EXAM: Blood pressure 113/71, pulse 67, temperature 97.9 F (36.6 C), temperature source Oral, height 6\' 1"  (1.854 m), weight 237 lb (107.5 kg), SpO2 97 %. Body mass index is 31.27 kg/m. Physical Exam  Constitutional: He is oriented to person, place, and time. He appears well-developed and well-nourished.  Cardiovascular: Normal rate.  Pulmonary/Chest: Effort normal.  Musculoskeletal: Normal range of motion.  Neurological: He is oriented to person, place, and time.  Skin: Skin is warm and dry.  Psychiatric: He has a normal mood and affect. His behavior is normal.  Vitals reviewed.   RECENT LABS AND TESTS: BMET    Component Value  Date/Time   NA 139 04/05/2018 1118   K 4.2 04/05/2018 1118   CL 102 04/05/2018 1118   CO2 22 04/05/2018 1118   GLUCOSE 97 04/05/2018 1118   GLUCOSE 97 05/26/2016 1630   BUN 14 04/05/2018 1118   CREATININE 1.07 04/05/2018 1118   CALCIUM 8.9 04/05/2018 1118   GFRNONAA 74 04/05/2018 1118   GFRAA 86 04/05/2018 1118   Lab Results  Component Value Date   HGBA1C 5.4 04/05/2018   Lab Results  Component Value Date   INSULIN 9.7 04/05/2018   CBC    Component Value Date/Time   WBC 6.5 04/05/2018 1118   RBC 5.24 04/05/2018 1118   HGB 15.6 04/05/2018 1118   HCT 47.7 04/05/2018 1118   MCV 91 04/05/2018 1118   MCH 29.8 04/05/2018 1118   MCHC 32.7 04/05/2018 1118   RDW 13.3 04/05/2018 1118   LYMPHSABS 1.3 04/05/2018 1118   EOSABS 0.1 04/05/2018 1118   BASOSABS 0.1 04/05/2018 1118   Iron/TIBC/Ferritin/ %Sat No results found for: IRON, TIBC, FERRITIN, IRONPCTSAT Lipid Panel     Component Value Date/Time   CHOL 176 04/05/2018 1118   TRIG 95 04/05/2018 1118   HDL 53 04/05/2018 1118   LDLCALC 104 (H) 04/05/2018 1118   Hepatic Function Panel     Component Value Date/Time   PROT 6.4 04/05/2018 1118   ALBUMIN 4.3 04/05/2018 1118   AST 21 04/05/2018 1118   ALT 12 04/05/2018 1118   ALKPHOS 65 04/05/2018 1118   BILITOT 0.4 04/05/2018 1118      Component Value Date/Time   TSH 1.140 04/05/2018 1118   Results for Meyer, Travis L (MRN [id number]) as of 05/12/2018 17:12  Ref. Range 04/05/2018 11:18  Vitamin D, 25-Hydroxy Latest Ref Range: 30.0 - 100.0 ng/mL 36.9   ASSESSMENT AND PLAN: Other constipation  Vitamin D deficiency - Plan: Vitamin D, Ergocalciferol, (DRISDOL) 50000 units CAPS capsule  Essential hypertension  At risk for osteoporosis  Class 1 obesity with serious comorbidity and body mass index (BMI) of 31.0 to 31.9 in adult, unspecified obesity type  PLAN:  Vitamin D Deficiency Audel was informed that low vitamin D levels contributes to fatigue and are associated with  obesity, breast, and colon cancer. He agrees to continue to take prescription Vit D @50 ,000 IU every week #4 with no refills and will follow up for routine testing of vitamin D, at least 2-3 times per year. He was informed of the risk of over-replacement of vitamin D and agrees to not increase his dose unless he discusses this with [place] first. Reznor agrees to follow up as directed.  At risk for osteopenia and osteoporosis Kaleab is at risk for osteopenia and osteoporosis due to his vitamin D deficiency. He was encouraged to take his vitamin D  and follow his higher calcium diet and increase strengthening exercise to help strengthen his bones and decrease his risk of osteopenia and osteoporosis.  Hypertension We discussed sodium restriction, working on healthy weight loss, and a regular exercise program as the means to achieve improved blood pressure control. Leavy CellaBoyd agreed with this plan and agreed to follow up as directed. We will continue to monitor his blood pressure as well as his progress with the above lifestyle modifications. He agrees to decrease amlodipine to 2.5 mg by mouth daily (patient to cut pill in half) and will watch for signs of hypotension as he continues his lifestyle modifications.  Constipation Leavy CellaBoyd was informed decrease bowel movement frequency is normal while losing weight, but stools should not be hard or painful. He was advised to increase his H20 intake and work on increasing his fiber intake. High fiber foods were discussed today. Leavy CellaBoyd will try Metamucil or Benefiber, then Miralax after 3 to 4 days and follow up as directed.  Obesity Leavy CellaBoyd is currently in the action stage of change. As such, his goal is to continue with weight loss efforts He has agreed to follow the Category 3 plan Leavy CellaBoyd has been instructed to work up to a goal of 150 minutes of combined cardio and strengthening exercise per week for weight loss and overall health benefits. We discussed the following Behavioral  Modification Strategies today: better snacking choices, planning for success, increasing lean protein intake, increasing vegetables and work on meal planning and easy cooking plans  Leavy CellaBoyd has agreed to follow up with our clinic in 2 weeks. He was informed of the importance of frequent follow up visits to maximize his success with intensive lifestyle modifications for his multiple health conditions.   OBESITY BEHAVIORAL INTERVENTION VISIT  Today's visit was # 3 out of 22.  Starting weight: 243 lbs Starting date: 04/05/18 Today's weight : 237 lbs Today's date: 05/12/2018 Total lbs lost to date: 6 (Patients must lose 7 lbs in the first 6 months to continue with counseling)   ASK: We discussed the diagnosis of obesity with Travis BeetsBoyd L Blakely today and Leavy CellaBoyd agreed to give us permission to discuss obesity behavioral modification therapy today.  ASSESS: Leavy CellaBoyd has the diagnosis of obesity and his BMI today is 31.27 Leavy CellaBoyd is in the action stage of change   ADVISE: Leavy CellaBoyd was educated on the multiple health risks of obesity as well as the benefit of weight loss to improve his health. He was advised of the need for long term treatment and the importance of lifestyle modifications.  AGREE: Multiple dietary modification options and treatment options were discussed and  Leavy CellaBoyd agreed to the above obesity treatment plan.  I, [place] CraneJoanne Murray, am acting as transcriptionist for Filbert SchilderAlexandria U. Kadolph, MD  I have reviewed the above documentation for accuracy and completeness, and I agree with the above. - Debbra RidingAlexandria Kadolph, MD

## 2018-05-26 ENCOUNTER — Ambulatory Visit (INDEPENDENT_AMBULATORY_CARE_PROVIDER_SITE_OTHER): Payer: 59 | Admitting: Family Medicine

## 2018-05-26 VITALS — BP 130/85 | HR 55 | Temp 97.5°F | Ht 73.0 in | Wt 236.0 lb

## 2018-05-26 DIAGNOSIS — Z9189 Other specified personal risk factors, not elsewhere classified: Secondary | ICD-10-CM

## 2018-05-26 DIAGNOSIS — E669 Obesity, unspecified: Secondary | ICD-10-CM

## 2018-05-26 DIAGNOSIS — E559 Vitamin D deficiency, unspecified: Secondary | ICD-10-CM | POA: Diagnosis not present

## 2018-05-26 DIAGNOSIS — Z6831 Body mass index (BMI) 31.0-31.9, adult: Secondary | ICD-10-CM | POA: Diagnosis not present

## 2018-05-26 DIAGNOSIS — I10 Essential (primary) hypertension: Secondary | ICD-10-CM

## 2018-05-26 MED ORDER — VITAMIN D (ERGOCALCIFEROL) 1.25 MG (50000 UNIT) PO CAPS: 50000.0000 [IU] | ORAL_CAPSULE | ORAL | 0 refills | Status: DC

## 2018-05-26 MED FILL — AMLODIPINE BESYLATE 5 MG TA: 5 | 90 days supply | Qty: 90 | Fill #1

## 2018-05-26 NOTE — Progress Notes (Signed)
Office: [phone]  /  Fax: [phone]   HPI:   Chief Complaint: OBESITY Travis Meyer is here to discuss his progress with his obesity treatment plan. He is on the Category 3 plan and is following his eating plan approximately 90 % of the time. He states he is exercising on the treadmill and trampoline for 60 minutes 4 to 5 times per week. Travis Meyer is feeling frustrated with only one pound of weight loss. He denies hunger or cravings or recent increase in activity. He is going to [place] next week for the fourth of July. His weight is 236 lb (107 kg) today and has had a weight loss of 1 pound over a period of 2 weeks since his last visit. He has lost 7 lbs since starting treatment with [place].  Vitamin D deficiency Travis Meyer has a diagnosis of vitamin D deficiency. Travis Meyer is on vit D and admits fatigue but denies nausea, vomiting or muscle weakness.  At risk for osteopenia and osteoporosis Travis Meyer is at higher risk of osteopenia and osteoporosis due to vitamin D deficiency.   Hypertension Travis Meyer is a 62 y.o. male with hypertension. Travis Meyer had a recent decrease in amlodipine. Travis Meyer denies chest pain, chest pressure or headache. He is working weight loss to help control his blood pressure with the goal of decreasing his risk of heart attack and stroke. Travis Meyer blood pressure is controlled today.  ALLERGIES: No Known Allergies  MEDICATIONS: Current Outpatient Medications on File Prior to Visit  Medication Sig Dispense Refill  . amLODipine (NORVASC) 5 MG tablet Take 5 mg by mouth as directed. Take 1/2 tab daily    . Multiple Vitamin (MULTIVITAMIN) tablet Take 1 tablet by mouth daily.    . Omega-3 Fatty Acids (FISH OIL) 1000 MG CAPS Take by mouth daily.    . tamsulosin (FLOMAX) 0.4 MG CAPS capsule Take 0.4 mg by mouth.    . vitamin C (ASCORBIC ACID) 500 MG tablet Take 500 mg by mouth daily.    . Vitamin D, Ergocalciferol, (DRISDOL) 50000 units CAPS capsule Take 1 capsule (50,000 Units total) by  mouth every 7 (seven) days. 4 capsule 0  . vitamin E 100 UNIT capsule Take by mouth daily.    Travis Meyer Kitchen zinc sulfate 220 (50 Zn) MG capsule Take 220 mg by mouth daily.     No current facility-administered medications on file prior to visit.     PAST MEDICAL HISTORY: Past Medical History:  Diagnosis Date  . Dizziness   . Frequent urination   . Hypertension     PAST SURGICAL HISTORY: Past Surgical History:  Procedure Laterality Date  . HERNIA REPAIR      SOCIAL HISTORY: Social History   Tobacco Use  . Smoking status: Never Smoker  . Smokeless tobacco: Never Used  Substance Use Topics  . Alcohol use: Yes    Comment: occassional  . Drug use: Never    FAMILY HISTORY: Family History  Problem Relation Age of Onset  . Stroke Mother   . Hypertension Mother   . Heart failure Father   . Heart disease Father   . Sudden death Father   . Alcoholism Father   . Obesity Father     ROS: Review of Systems  Constitutional: Positive for malaise/fatigue and weight loss.  Gastrointestinal: Negative for nausea and vomiting.  Musculoskeletal:       Negative for muscle weakness    PHYSICAL EXAM: Blood pressure 130/85, pulse (!) 55, temperature (!) 97.5 F (36.4  C), temperature source Oral, height 6\' 1"  (1.854 m), weight 236 lb (107 kg), SpO2 99 %. Body mass index is 31.14 kg/m. Physical Exam  Constitutional: He is oriented to person, place, and time. He appears well-developed and well-nourished.  Cardiovascular: Normal rate.  Pulmonary/Chest: Effort normal.  Musculoskeletal: Normal range of motion.  Neurological: He is oriented to person, place, and time.  Skin: Skin is warm and dry.  Psychiatric: He has a normal mood and affect. His behavior is normal.  Vitals reviewed.   RECENT LABS AND TESTS: BMET    Component Value Date/Time   NA 139 04/05/2018 1118   K 4.2 04/05/2018 1118   CL 102 04/05/2018 1118   CO2 22 04/05/2018 1118   GLUCOSE 97 04/05/2018 1118   GLUCOSE 97  05/26/2016 1630   BUN 14 04/05/2018 1118   CREATININE 1.07 04/05/2018 1118   CALCIUM 8.9 04/05/2018 1118   GFRNONAA 74 04/05/2018 1118   GFRAA 86 04/05/2018 1118   Lab Results  Component Value Date   HGBA1C 5.4 04/05/2018   Lab Results  Component Value Date   INSULIN 9.7 04/05/2018   CBC    Component Value Date/Time   WBC 6.5 04/05/2018 1118   RBC 5.24 04/05/2018 1118   HGB 15.6 04/05/2018 1118   HCT 47.7 04/05/2018 1118   MCV 91 04/05/2018 1118   MCH 29.8 04/05/2018 1118   MCHC 32.7 04/05/2018 1118   RDW 13.3 04/05/2018 1118   LYMPHSABS 1.3 04/05/2018 1118   EOSABS 0.1 04/05/2018 1118   BASOSABS 0.1 04/05/2018 1118   Iron/TIBC/Ferritin/ %Sat No results found for: IRON, TIBC, FERRITIN, IRONPCTSAT Lipid Panel     Component Value Date/Time   CHOL 176 04/05/2018 1118   TRIG 95 04/05/2018 1118   HDL 53 04/05/2018 1118   LDLCALC 104 (H) 04/05/2018 1118   Hepatic Function Panel     Component Value Date/Time   PROT 6.4 04/05/2018 1118   ALBUMIN 4.3 04/05/2018 1118   AST 21 04/05/2018 1118   ALT 12 04/05/2018 1118   ALKPHOS 65 04/05/2018 1118   BILITOT 0.4 04/05/2018 1118      Component Value Date/Time   TSH 1.140 04/05/2018 1118   Results for Deeb, Otto L (MRN [id number]) as of 05/26/2018 16:23  Ref. Range 04/05/2018 11:18  Vitamin D, 25-Hydroxy Latest Ref Range: 30.0 - 100.0 ng/mL 36.9   ASSESSMENT AND PLAN: Vitamin D deficiency - Plan: Vitamin D, Ergocalciferol, (DRISDOL) 50000 units CAPS capsule  Essential hypertension  At risk for osteoporosis  Class 1 obesity with serious comorbidity and body mass index (BMI) of 31.0 to 31.9 in adult, unspecified obesity type  PLAN:  Vitamin D Deficiency Travis Meyer was informed that low vitamin D levels contributes to fatigue and are associated with obesity, breast, and colon cancer. He agrees to continue to take prescription Vit D @50 ,000 IU every week #4 with no refills and will follow up for routine testing of vitamin D,  at least 2-3 times per year. He was informed of the risk of over-replacement of vitamin D and agrees to not increase his dose unless he discusses this with us first. Travis Meyer agrees to follow up as directed.  At risk for osteopenia and osteoporosis Travis Meyer is at risk for osteopenia and osteoporosis due to his vitamin D deficiency. He was encouraged to take his vitamin D and follow his higher calcium diet and increase strengthening exercise to help strengthen his bones and decrease his risk of osteopenia and osteoporosis.  Hypertension We  discussed sodium restriction, working on healthy weight loss, and a regular exercise program as the means to achieve improved blood pressure control. Travis Meyer agreed with this plan and agreed to follow up as directed. We will follow up at the next appointment and will continue to monitor his blood pressure as well as his progress with the above lifestyle modifications. He will continue amlodipine 2.5 mg daily and will watch for signs of hypotension as he continues his lifestyle modifications.  Obesity Travis Meyer is currently in the action stage of change. As such, his goal is to continue with weight loss efforts He has agreed to keep a food journal with 1450 to 1600 calories and +90+ grams of protein daily or follow the Category 3 plan Travis Meyer has been instructed to work up to a goal of 150 minutes of combined cardio and strengthening exercise per week for weight loss and overall health benefits. We discussed the following Behavioral Modification Strategies today: planning for success, keep a strict food journal, increasing lean protein intake, increasing vegetables and work on meal planning and easy cooking plans  Travis Meyer has agreed to follow up with our clinic in 2 weeks. He was informed of the importance of frequent follow up visits to maximize his success with intensive lifestyle modifications for his multiple health conditions.   OBESITY BEHAVIORAL INTERVENTION VISIT  Today's  visit was # 4 out of 22.  Starting weight: 243 lbs Starting date: 04/05/18 Today's weight : 236 lbs Today's date: 05/26/2018 Total lbs lost to date: 7 (Patients must lose 7 lbs in the first 6 months to continue with counseling)   ASK: We discussed the diagnosis of obesity with Travis Meyer today and Travis Meyer agreed to give [place] permission to discuss obesity behavioral modification therapy today.  ASSESS: Ebb has the diagnosis of obesity and his BMI today is 31.14 Rakin is in the action stage of change   ADVISE: Kaius was educated on the multiple health risks of obesity as well as the benefit of weight loss to improve his health. He was advised of the need for long term treatment and the importance of lifestyle modifications.  AGREE: Multiple dietary modification options and treatment options were discussed and  Bridget agreed to the above obesity treatment plan.  I, [place] Crane, am acting as transcriptionist for Filbert Schilder, MD  I have reviewed the above documentation for accuracy and completeness, and I agree with the above. - Debbra Riding, MD

## 2018-06-15 ENCOUNTER — Ambulatory Visit (INDEPENDENT_AMBULATORY_CARE_PROVIDER_SITE_OTHER): Payer: 59 | Admitting: Family Medicine

## 2018-06-15 VITALS — BP 133/78 | HR 61 | Temp 97.7°F | Ht 73.0 in | Wt 238.0 lb

## 2018-06-15 DIAGNOSIS — Z6831 Body mass index (BMI) 31.0-31.9, adult: Secondary | ICD-10-CM

## 2018-06-15 DIAGNOSIS — E669 Obesity, unspecified: Secondary | ICD-10-CM | POA: Diagnosis not present

## 2018-06-15 DIAGNOSIS — I10 Essential (primary) hypertension: Secondary | ICD-10-CM

## 2018-06-15 DIAGNOSIS — E559 Vitamin D deficiency, unspecified: Secondary | ICD-10-CM | POA: Diagnosis not present

## 2018-06-15 DIAGNOSIS — Z9189 Other specified personal risk factors, not elsewhere classified: Secondary | ICD-10-CM | POA: Diagnosis not present

## 2018-06-15 MED ORDER — AMLODIPINE BESYLATE 2.5 MG PO TABS: 2.5000 mg | ORAL_TABLET | ORAL | 0 refills | Status: AC

## 2018-06-15 MED ORDER — VITAMIN D (ERGOCALCIFEROL) 1.25 MG (50000 UNIT) PO CAPS: 50000.0000 [IU] | ORAL_CAPSULE | ORAL | 0 refills | Status: DC

## 2018-06-15 NOTE — Progress Notes (Signed)
Office: [phone]  /  Fax: [phone]   HPI:   Chief Complaint: OBESITY Travis Meyer is here to discuss his progress with his obesity treatment plan. He is on the keep a food journal with 1450 to 1600 calories and 90+ grams of protein daily or the Category 3 plan and is following his eating plan approximately 80 % of the time. He states he is exercising 60 minutes 3 to 4 times per week. Travis Meyer went to [place] 4[place] weekend. He is eating spot on plan and is following the plan on [employer]. Hunger is controlled. His weight is 238 lb (108 kg) today and has had a weight gain of 2 pounds over a period of 3 weeks since his last visit. He has lost 5 lbs since starting treatment with us.  Vitamin D deficiency Travis Meyer has a diagnosis of vitamin D deficiency. He is currently taking prescription vit D. He admits fatigue and denies nausea, vomiting or muscle weakness.  Hypertension Travis Meyer is a 62 y.o. male with hypertension.  Travis Meyer denies chest pain, chest pressure or headache. He is working weight loss to help control his blood pressure with the goal of decreasing his risk of heart attack and stroke. Travis Meyer blood pressure is currently controlled.  At risk for cardiovascular disease Travis Meyer is at a higher than average risk for cardiovascular disease due to obesity and hypertension. He currently denies any chest pain.  ALLERGIES: No Known Allergies  MEDICATIONS: Current Outpatient Medications on File Prior to Visit  Medication Sig Dispense Refill  . Multiple Vitamin (MULTIVITAMIN) tablet Take 1 tablet by mouth daily.    . Omega-3 Fatty Acids (FISH OIL) 1000 MG CAPS Take by mouth daily.    . tamsulosin (FLOMAX) 0.4 MG CAPS capsule Take 0.4 mg by mouth.    . vitamin C (ASCORBIC ACID) 500 MG tablet Take 500 mg by mouth daily.    . vitamin E 100 UNIT capsule Take by mouth daily.    [name]. zinc sulfate 220 (50 Zn) MG capsule Take 220 mg by mouth daily.     No current facility-administered  medications on file prior to visit.     PAST MEDICAL HISTORY: Past Medical History:  Diagnosis Date  . Dizziness   . Frequent urination   . Hypertension     PAST SURGICAL HISTORY: Past Surgical History:  Procedure Laterality Date  . HERNIA REPAIR      SOCIAL HISTORY: Social History   Tobacco Use  . Smoking status: Never Smoker  . Smokeless tobacco: Never Used  Substance Use Topics  . Alcohol use: Yes    Comment: occassional  . Drug use: Never    FAMILY HISTORY: Family History  Problem Relation Age of Onset  . Stroke Mother   . Hypertension Mother   . Heart failure Father   . Heart disease Father   . Sudden death Father   . Alcoholism Father   . Obesity Father     ROS: Review of Systems  Constitutional: Positive for malaise/fatigue. Negative for weight loss.  Cardiovascular: Negative for chest pain.       Negative for chest pressure  Gastrointestinal: Negative for nausea and vomiting.  Musculoskeletal:       Negative for muscle weakness  Neurological: Negative for headaches.    PHYSICAL EXAM: Blood pressure 133/78, pulse 61, temperature 97.7 F (36.5 C), temperature source Oral, height 6\' 1"  (1.854 m), weight 238 lb (108 kg), SpO2 98 %. Body mass index is  31.4 kg/m. Physical Exam  Constitutional: He is oriented to person, place, and time. He appears well-developed and well-nourished.  Cardiovascular: Normal rate.  Pulmonary/Chest: Effort normal.  Musculoskeletal: Normal range of motion.  Neurological: He is oriented to person, place, and time.  Skin: Skin is warm and dry.  Psychiatric: He has a normal mood and affect. His behavior is normal.  Vitals reviewed.   RECENT LABS AND TESTS: BMET    Component Value Date/Time   NA 139 04/05/2018 1118   K 4.2 04/05/2018 1118   CL 102 04/05/2018 1118   CO2 22 04/05/2018 1118   GLUCOSE 97 04/05/2018 1118   GLUCOSE 97 05/26/2016 1630   BUN 14 04/05/2018 1118   CREATININE 1.07 04/05/2018 1118   CALCIUM  8.9 04/05/2018 1118   GFRNONAA 74 04/05/2018 1118   GFRAA 86 04/05/2018 1118   Lab Results  Component Value Date   HGBA1C 5.4 04/05/2018   Lab Results  Component Value Date   INSULIN 9.7 04/05/2018   CBC    Component Value Date/Time   WBC 6.5 04/05/2018 1118   RBC 5.24 04/05/2018 1118   HGB 15.6 04/05/2018 1118   HCT 47.7 04/05/2018 1118   MCV 91 04/05/2018 1118   MCH 29.8 04/05/2018 1118   MCHC 32.7 04/05/2018 1118   RDW 13.3 04/05/2018 1118   LYMPHSABS 1.3 04/05/2018 1118   EOSABS 0.1 04/05/2018 1118   BASOSABS 0.1 04/05/2018 1118   Iron/TIBC/Ferritin/ %Sat No results found for: IRON, TIBC, FERRITIN, IRONPCTSAT Lipid Panel     Component Value Date/Time   CHOL 176 04/05/2018 1118   TRIG 95 04/05/2018 1118   HDL 53 04/05/2018 1118   LDLCALC 104 (H) 04/05/2018 1118   Hepatic Function Panel     Component Value Date/Time   PROT 6.4 04/05/2018 1118   ALBUMIN 4.3 04/05/2018 1118   AST 21 04/05/2018 1118   ALT 12 04/05/2018 1118   ALKPHOS 65 04/05/2018 1118   BILITOT 0.4 04/05/2018 1118      Component Value Date/Time   TSH 1.140 04/05/2018 1118   Results for Meyer, Travis L (MRN [id number]) as of 06/15/2018 16:29  Ref. Range 04/05/2018 11:18  Vitamin D, 25-Hydroxy Latest Ref Range: 30.0 - 100.0 ng/mL 36.9   ASSESSMENT AND PLAN: Vitamin D deficiency - Plan: Vitamin D, Ergocalciferol, (DRISDOL) 50000 units CAPS capsule  Essential hypertension - Plan: amLODipine (NORVASC) 2.5 MG tablet  At risk for heart disease  Class 1 obesity with serious comorbidity and body mass index (BMI) of 31.0 to 31.9 in adult, unspecified obesity type  PLAN:  Vitamin D Deficiency Travis Meyer was informed that low vitamin D levels contributes to fatigue and are associated with obesity, breast, and colon cancer. He agrees to continue to take prescription Vit D @50 ,000 IU every week #4 with no refills and will follow up for routine testing of vitamin D, at least 2-3 times per year. He was informed  of the risk of over-replacement of vitamin D and agrees to not increase his dose unless he discusses this with [place] first. Travis Meyer agrees to follow up as directed.  Hypertension We discussed sodium restriction, working on healthy weight loss, and a regular exercise program as the means to achieve improved blood pressure control. Travis Cella agreed with this plan and agreed to follow up as directed. We will continue to monitor his blood pressure as well as his progress with the above lifestyle modifications. He agrees to take Amlodipine 2.5 mg by mouth daily #30 with no  refills and will watch for signs of hypotension as he continues his lifestyle modifications.  Cardiovascular risk counseling Zacharie was given extended (15 minutes) coronary artery disease prevention counseling today. He is 62 y.o. male and has risk factors for heart disease including obesity and hypertension. We discussed intensive lifestyle modifications today with an emphasis on specific weight loss instructions and strategies. Pt was also informed of the importance of increasing exercise and decreasing saturated fats to help prevent heart disease.  Obesity Malcomb is currently in the action stage of change. As such, his goal is to continue with weight loss efforts He has agreed to follow the Category 3 plan Rodderick has been instructed to work up to a goal of 150 minutes of combined cardio and strengthening exercise per week for weight loss and overall health benefits. We discussed the following Behavioral Modification Strategies today: planning for success, increasing lean protein intake, increasing vegetables and work on meal planning and easy cooking plans  Jeffrey has agreed to follow up with our clinic in 2 weeks. He was informed of the importance of frequent follow up visits to maximize his success with intensive lifestyle modifications for his multiple health conditions.   OBESITY BEHAVIORAL INTERVENTION VISIT  Today's visit was # 5 out of  22.  Starting weight: 243 lbs Starting date: 04/05/18 Today's weight : 238 lbs  Today's date: 06/15/2018 Total lbs lost to date: 5    ASK: We discussed the diagnosis of obesity with Travis Beets today and Elma agreed to give [place] permission to discuss obesity behavioral modification therapy today.  ASSESS: Jarryn has the diagnosis of obesity and his BMI today is 31.41 Jahrel is in the action stage of change   ADVISE: Brainard was educated on the multiple health risks of obesity as well as the benefit of weight loss to improve his health. He was advised of the need for long term treatment and the importance of lifestyle modifications.  AGREE: Multiple dietary modification options and treatment options were discussed and  Jeramey agreed to the above obesity treatment plan.  I, [place] Crane, am acting as transcriptionist for Filbert Schilder, MD  I have reviewed the above documentation for accuracy and completeness, and I agree with the above. - Debbra Riding, MD

## 2018-06-24 MED FILL — VIT D2 1.25 MG (50,000 UNIT: 1.25 MG | 28 days supply | Qty: 4 | Fill #0

## 2018-06-28 ENCOUNTER — Ambulatory Visit (INDEPENDENT_AMBULATORY_CARE_PROVIDER_SITE_OTHER): Payer: 59 | Admitting: Family Medicine

## 2018-06-28 VITALS — BP 126/79 | HR 49 | Temp 97.5°F | Ht 73.0 in | Wt 235.0 lb

## 2018-06-28 DIAGNOSIS — E669 Obesity, unspecified: Secondary | ICD-10-CM

## 2018-06-28 DIAGNOSIS — Z6831 Body mass index (BMI) 31.0-31.9, adult: Secondary | ICD-10-CM | POA: Diagnosis not present

## 2018-06-28 DIAGNOSIS — I10 Essential (primary) hypertension: Secondary | ICD-10-CM

## 2018-06-28 DIAGNOSIS — N400 Enlarged prostate without lower urinary tract symptoms: Secondary | ICD-10-CM

## 2018-06-29 NOTE — Progress Notes (Signed)
Office: [phone]  /  Fax: [phone]   HPI:   Chief Complaint: OBESITY Travis Meyer is here to discuss his progress with his obesity treatment plan. He is on the Category 3 plan and is following his eating plan approximately 85 to 90 % of the time. He states he is exercising on the treadmill and rebounder for 10 to 90 minutes 4 times per week. Travis Meyer did a 24 hour fast and helped control his cravings. He has no significant cravings. Travis Meyer is looking for options to eat less meat. His weight is 235 lb (106.6 kg) today and has had a weight loss of 3 pounds over a period of 2 weeks since his last visit. He has lost 8 lbs since starting treatment with [place].  Hypertension Travis Meyer is a 61 y.o. male with hypertension.  Travis Meyer denies chest pain, chest pressure or headache. Travis Meyer denies dizziness or lightheadedness. He is working weight loss to help control his blood pressure with the goal of decreasing his risk of heart attack and stroke. Travis Meyer blood pressure is controlled today.  BPH (benign prostatic hyperplasia) Travis Meyer has a diagnosis of benign prostatic hyperplasia, and no change is noted using Flomax.  ALLERGIES: No Known Allergies  MEDICATIONS: Current Outpatient Medications on File Prior to Visit  Medication Sig Dispense Refill  . amLODipine (NORVASC) 2.5 MG tablet Take 1 tablet (2.5 mg total) by mouth as directed. 30 tablet 0  . Multiple Vitamin (MULTIVITAMIN) tablet Take 1 tablet by mouth daily.    . Omega-3 Fatty Acids (FISH OIL) 1000 MG CAPS Take by mouth daily.    . vitamin C (ASCORBIC ACID) 500 MG tablet Take 500 mg by mouth daily.    . Vitamin D, Ergocalciferol, (DRISDOL) 50000 units CAPS capsule Take 1 capsule (50,000 Units total) by mouth every 7 (seven) days. 4 capsule 0  . vitamin E 100 UNIT capsule Take by mouth daily.    Travis Meyer Kitchen zinc sulfate 220 (50 Zn) MG capsule Take 220 mg by mouth daily.     No current facility-administered medications on file prior to visit.     PAST  MEDICAL HISTORY: Past Medical History:  Diagnosis Date  . Dizziness   . Frequent urination   . Hypertension     PAST SURGICAL HISTORY: Past Surgical History:  Procedure Laterality Date  . HERNIA REPAIR      SOCIAL HISTORY: Social History   Tobacco Use  . Smoking status: Never Smoker  . Smokeless tobacco: Never Used  Substance Use Topics  . Alcohol use: Yes    Comment: occassional  . Drug use: Never    FAMILY HISTORY: Family History  Problem Relation Age of Onset  . Stroke Mother   . Hypertension Mother   . Heart failure Father   . Heart disease Father   . Sudden death Father   . Alcoholism Father   . Obesity Father     ROS: Review of Systems  Constitutional: Positive for weight loss.  Cardiovascular: Negative for chest pain.       Negative for chest pressure  Neurological: Negative for dizziness and headaches.       Negative for lightheadedness    PHYSICAL EXAM: Blood pressure 126/79, pulse (!) 49, temperature (!) 97.5 F (36.4 C), temperature source Oral, height 6\' 1"  (1.854 m), weight 235 lb (106.6 kg), SpO2 97 %. Body mass index is 31 kg/m. Physical Exam  Constitutional: He is oriented to person, place, and time. He appears well-developed and well-nourished.  Cardiovascular: Normal rate.  Pulmonary/Chest: Effort normal.  Musculoskeletal: Normal range of motion.  Neurological: He is oriented to person, place, and time.  Skin: Skin is warm and dry.  Psychiatric: He has a normal mood and affect. His behavior is normal.  Vitals reviewed.   RECENT LABS AND TESTS: BMET    Component Value Date/Time   NA 139 04/05/2018 1118   K 4.2 04/05/2018 1118   CL 102 04/05/2018 1118   CO2 22 04/05/2018 1118   GLUCOSE 97 04/05/2018 1118   GLUCOSE 97 05/26/2016 1630   BUN 14 04/05/2018 1118   CREATININE 1.07 04/05/2018 1118   CALCIUM 8.9 04/05/2018 1118   GFRNONAA 74 04/05/2018 1118   GFRAA 86 04/05/2018 1118   Lab Results  Component Value Date   HGBA1C  5.4 04/05/2018   Lab Results  Component Value Date   INSULIN 9.7 04/05/2018   CBC    Component Value Date/Time   WBC 6.5 04/05/2018 1118   RBC 5.24 04/05/2018 1118   HGB 15.6 04/05/2018 1118   HCT 47.7 04/05/2018 1118   MCV 91 04/05/2018 1118   MCH 29.8 04/05/2018 1118   MCHC 32.7 04/05/2018 1118   RDW 13.3 04/05/2018 1118   LYMPHSABS 1.3 04/05/2018 1118   EOSABS 0.1 04/05/2018 1118   BASOSABS 0.1 04/05/2018 1118   Iron/TIBC/Ferritin/ %Sat No results found for: IRON, TIBC, FERRITIN, IRONPCTSAT Lipid Panel     Component Value Date/Time   CHOL 176 04/05/2018 1118   TRIG 95 04/05/2018 1118   HDL 53 04/05/2018 1118   LDLCALC 104 (H) 04/05/2018 1118   Hepatic Function Panel     Component Value Date/Time   PROT 6.4 04/05/2018 1118   ALBUMIN 4.3 04/05/2018 1118   AST 21 04/05/2018 1118   ALT 12 04/05/2018 1118   ALKPHOS 65 04/05/2018 1118   BILITOT 0.4 04/05/2018 1118      Component Value Date/Time   TSH 1.140 04/05/2018 1118   Results for Nero, Travis Meyer (MRN [id number]) as of 06/29/2018 15:43  Ref. Range 04/05/2018 11:18  Vitamin D, 25-Hydroxy Latest Ref Range: 30.0 - 100.0 ng/mL 36.9   ASSESSMENT AND PLAN: Essential hypertension  Benign prostatic hyperplasia, unspecified whether lower urinary tract symptoms present  Class 1 obesity with serious comorbidity and body mass index (BMI) of 31.0 to 31.9 in adult, unspecified obesity type  PLAN:  Hypertension We discussed sodium restriction, working on healthy weight loss, and a regular exercise program as the means to achieve improved blood pressure control. Travis Cella agreed with this plan and agreed to follow up as directed. We will continue to monitor his blood pressure as well as his progress with the above lifestyle modifications. He will continue Amlodipine 2.5 mg daily and will watch for signs of hypotension as he continues his lifestyle modifications.  BPH (benign prostatic hyperplasia) Travis Meyer will stop Flomax and follow  up with our clinic at the agreed upon time.  We spent > than 50% of the 15 minute visit on the counseling as documented in the note.  Obesity Travis Meyer is currently in the action stage of change. As such, his goal is to continue with weight loss efforts He has agreed to follow the Category 3 plan Travis Meyer can add 5 to 10 minutes of resistance training 2 to 3 times per week. We discussed the following Behavioral Modification Strategies today: planning for success, increasing lean protein intake, increasing vegetables and work on meal planning and easy cooking plans  Travis Meyer has agreed to follow  up with our clinic in 2 weeks. He was informed of the importance of frequent follow up visits to maximize his success with intensive lifestyle modifications for his multiple health conditions.   OBESITY BEHAVIORAL INTERVENTION VISIT  Today's visit was # 6 out of 22.  Starting weight: 243 lbs Starting date: 04/05/18 Today's weight :235 lbs  Today's date: 06/28/2018 Total lbs lost to date: 8    ASK: We discussed the diagnosis of obesity with Travis Meyer today and Travis Meyer agreed to give Travis Meyer permission to discuss obesity behavioral modification therapy today.  ASSESS: Travis Meyer has the diagnosis of obesity and his BMI today is 31.01 Travis Meyer is in the action stage of change   ADVISE: Travis Meyer was educated on the multiple health risks of obesity as well as the benefit of weight loss to improve his health. He was advised of the need for long term treatment and the importance of lifestyle modifications.  AGREE: Multiple dietary modification options and treatment options were discussed and  Travis Meyer agreed to the above obesity treatment plan.  I, [place] CraneJoanne Murray, am acting as transcriptionist for Filbert SchilderAlexandria U. Kadolph, MD  I have reviewed the above documentation for accuracy and completeness, and I agree with the above. - Debbra RidingAlexandria Kadolph, MD

## 2018-07-19 ENCOUNTER — Ambulatory Visit (INDEPENDENT_AMBULATORY_CARE_PROVIDER_SITE_OTHER): Payer: 59 | Admitting: Family Medicine

## 2018-07-19 VITALS — BP 144/79 | HR 51 | Temp 97.5°F | Ht 73.0 in | Wt 239.0 lb

## 2018-07-19 DIAGNOSIS — E559 Vitamin D deficiency, unspecified: Secondary | ICD-10-CM

## 2018-07-19 DIAGNOSIS — Z9189 Other specified personal risk factors, not elsewhere classified: Secondary | ICD-10-CM | POA: Diagnosis not present

## 2018-07-19 DIAGNOSIS — Z6831 Body mass index (BMI) 31.0-31.9, adult: Secondary | ICD-10-CM

## 2018-07-19 DIAGNOSIS — I10 Essential (primary) hypertension: Secondary | ICD-10-CM | POA: Diagnosis not present

## 2018-07-19 DIAGNOSIS — E669 Obesity, unspecified: Secondary | ICD-10-CM

## 2018-07-19 MED ORDER — VITAMIN D (ERGOCALCIFEROL) 1.25 MG (50000 UNIT) PO CAPS: 50000.0000 [IU] | ORAL_CAPSULE | ORAL | 0 refills | Status: DC

## 2018-07-19 MED FILL — VIT D2 1.25 MG (50,000 UNIT: 1.25 MG | 28 days supply | Qty: 4 | Fill #0

## 2018-07-19 NOTE — Progress Notes (Signed)
Office: [phone]  /  Fax: [phone]   HPI:   Chief Complaint: OBESITY Travis Meyer is here to discuss his progress with his obesity treatment plan. He is on the Category 3 plan and is following his eating plan approximately 80 % of the time. He states he is exercising 0 minutes 0 times per week. Travis Meyer wife fell and dislocated her elbow. She was the main cook in the house. His weight is 239 lb (108.4 kg) today and has not lost weight since his last visit. He has lost 4 lbs since starting treatment with Travis Meyer.  Vitamin D Meyer Travis Meyer. Travis Meyer vit D and he admits fatigue, but denies nausea, vomiting or muscle weakness.  At risk for osteopenia and osteoporosis Travis Meyer is at higher risk of osteopenia and osteoporosis due to vitamin D Meyer.   Hypertension Travis Meyer is a 62 y.o. male with hypertension. His blood pressure is elevated today, but he is extremely stressed.  Travis Meyer denies chest pain, chest pressure or headache. He is working weight loss to help control his blood pressure with the goal of decreasing his risk of heart attack and stroke. Travis Meyer blood pressure is not currently controlled.  ALLERGIES: No Known Allergies  MEDICATIONS: Current Outpatient Medications on File Prior to Visit  Medication Sig Dispense Refill  . amLODipine (NORVASC) 2.5 MG tablet Take 1 tablet (2.5 mg total) by mouth as directed. 30 tablet 0  . Multiple Vitamin (MULTIVITAMIN) tablet Take 1 tablet by mouth daily.    . Omega-3 Fatty Acids (FISH OIL) 1000 MG CAPS Take by mouth daily.    . vitamin C (ASCORBIC ACID) 500 MG tablet Take 500 mg by mouth daily.    . vitamin E 100 UNIT capsule Take by mouth daily.    [name]. zinc sulfate 220 (50 Zn) MG capsule Take 220 mg by mouth daily.     No current facility-administered medications on file prior to visit.     PAST MEDICAL HISTORY: Past Medical History:  Diagnosis Date  . Dizziness   . Frequent  urination   . Hypertension     PAST SURGICAL HISTORY: Past Surgical History:  Procedure Laterality Date  . HERNIA REPAIR      SOCIAL HISTORY: Social History   Tobacco Use  . Smoking status: Never Smoker  . Smokeless tobacco: Never Used  Substance Use Topics  . Alcohol use: Yes    Comment: occassional  . Drug use: Never    FAMILY HISTORY: Family History  Problem Relation Age of Onset  . Stroke Mother   . Hypertension Mother   . Heart failure Father   . Heart disease Father   . Sudden death Father   . Alcoholism Father   . Obesity Father     ROS: Review of Systems  Constitutional: Positive for malaise/fatigue. Negative for weight loss.  Cardiovascular: Negative for chest pain.       Negative for chest pressure  Gastrointestinal: Negative for nausea and vomiting.  Musculoskeletal:       Negative for muscle weakness  Neurological: Negative for headaches.    PHYSICAL EXAM: Blood pressure (!) 144/79, pulse (!) 51, temperature (!) 97.5 F (36.4 C), temperature source Oral, height 6\' 1"  (1.854 m), weight 239 lb (108.4 kg), SpO2 97 %. Body mass index is 31.53 kg/m. Physical Exam  Constitutional: He is oriented to person, place, and time. He appears well-developed and well-nourished.  Cardiovascular: Normal rate.  Pulmonary/Chest: Effort normal.  Musculoskeletal: Normal range of motion.  Neurological: He is oriented to person, place, and time.  Skin: Skin is warm and dry.  Psychiatric: He has a normal mood and affect. His behavior is normal.  Vitals reviewed.   RECENT LABS AND TESTS: BMET    Component Value Date/Time   NA 139 04/05/2018 1118   K 4.2 04/05/2018 1118   CL 102 04/05/2018 1118   CO2 22 04/05/2018 1118   GLUCOSE 97 04/05/2018 1118   GLUCOSE 97 05/26/2016 1630   BUN 14 04/05/2018 1118   CREATININE 1.07 04/05/2018 1118   CALCIUM 8.9 04/05/2018 1118   GFRNONAA 74 04/05/2018 1118   GFRAA 86 04/05/2018 1118   Lab Results  Component Value Date    HGBA1C 5.4 04/05/2018   Lab Results  Component Value Date   INSULIN 9.7 04/05/2018   CBC    Component Value Date/Time   WBC 6.5 04/05/2018 1118   RBC 5.24 04/05/2018 1118   HGB 15.6 04/05/2018 1118   HCT 47.7 04/05/2018 1118   MCV 91 04/05/2018 1118   MCH 29.8 04/05/2018 1118   MCHC 32.7 04/05/2018 1118   RDW 13.3 04/05/2018 1118   LYMPHSABS 1.3 04/05/2018 1118   EOSABS 0.1 04/05/2018 1118   BASOSABS 0.1 04/05/2018 1118   Iron/TIBC/Ferritin/ %Sat No results found for: IRON, TIBC, FERRITIN, IRONPCTSAT Lipid Panel     Component Value Date/Time   CHOL 176 04/05/2018 1118   TRIG 95 04/05/2018 1118   HDL 53 04/05/2018 1118   LDLCALC 104 (H) 04/05/2018 1118   Hepatic Function Panel     Component Value Date/Time   PROT 6.4 04/05/2018 1118   ALBUMIN 4.3 04/05/2018 1118   AST 21 04/05/2018 1118   ALT 12 04/05/2018 1118   ALKPHOS 65 04/05/2018 1118   BILITOT 0.4 04/05/2018 1118      Component Value Date/Time   TSH 1.140 04/05/2018 1118   Results for [name], Travis Meyer (MRN [id number]) as of 07/19/2018 17:21  Ref. Range 04/05/2018 11:18  Vitamin D, 25-Hydroxy Latest Ref Range: 30.0 - 100.0 ng/mL 36.9   ASSESSMENT AND PLAN: Vitamin D Meyer - Plan: Vitamin D, Ergocalciferol, (DRISDOL) 50000 units CAPS capsule  Essential hypertension  At risk for osteoporosis  Class 1 obesity with serious comorbidity and body mass index (BMI) of 31.0 to 31.9 in adult, unspecified obesity type  PLAN:  Vitamin D Meyer Travis Meyer was informed that low vitamin D levels contributes to fatigue and are associated with obesity, breast, and colon cancer. He agrees to continue to take prescription Vit D @50 ,000 IU every week #4 with no refills and will follow up for routine testing of vitamin D, at least 2-3 times per year. He was informed of the risk of over-replacement of vitamin D and agrees to not increase his dose unless he discusses this with [place] first. Travis Meyer agrees to follow up as  directed.  At risk for osteopenia and osteoporosis Travis Meyer is at risk for osteopenia and osteoporosis due to his vitamin D Meyer. He was encouraged to take his vitamin D and follow his higher calcium diet and increase strengthening exercise to help strengthen his bones and decrease his risk of osteopenia and osteoporosis.  Hypertension We discussed sodium restriction, working on healthy weight loss, and a regular exercise program as the means to achieve improved blood pressure control. Travis Cella agreed with this plan and agreed to follow up as directed. We will follow up at the next appointment and will continue  to monitor his blood pressure as well as his progress with the above lifestyle modifications. He will continue his medications as prescribed and will watch for signs of hypotension as he continues his lifestyle modifications.   Obesity  Travis Meyer is currently in the action stage of change. As such, his goal is to continue with weight loss efforts He has agreed to keep a food journal with 450 to 600 calories and 35+ grams of protein at supper daily and follow the Category 3 plan Travis Meyer has been instructed to work up to a goal of 150 minutes of combined cardio and strengthening exercise per week for weight loss and overall health benefits. We discussed the following Behavioral Modification Strategies today: better snacking choices, increasing lean protein intake and work on meal planning and easy cooking plans  Travis Meyer has agreed to follow up with our clinic in 2 to 3 weeks. He was informed of the importance of frequent follow up visits to maximize his success with intensive lifestyle modifications for his multiple health conditions.   OBESITY BEHAVIORAL INTERVENTION VISIT  Today's visit was # 7 out of 22.  Starting weight: 243 lbs Starting date: 04/05/18 Today's weight : 239 lbs  Today's date: 07/19/2018 Total lbs lost to date: 4    ASK: We discussed the diagnosis of obesity with Travis BeetsBoyd Meyer Meyer  today and Travis Meyer agreed to give Travis Meyer permission to discuss obesity behavioral modification therapy today.  ASSESS: Travis Meyer has the diagnosis of obesity and his BMI today is 31.54 Travis Meyer is in the action stage of change   ADVISE: Travis Meyer was educated on the multiple health risks of obesity as well as the benefit of weight loss to improve his health. He was advised of the need for long term treatment and the importance of lifestyle modifications.  AGREE: Multiple dietary modification options and treatment options were discussed and  Travis Meyer agreed to the above obesity treatment plan.  Cristi LoronI, Joanne Murray, am acting as transcriptionist for Filbert SchilderAlexandria U. Kadolph, MD

## 2018-08-03 ENCOUNTER — Ambulatory Visit (INDEPENDENT_AMBULATORY_CARE_PROVIDER_SITE_OTHER): Payer: 59 | Admitting: Bariatrics

## 2018-08-03 ENCOUNTER — Encounter (INDEPENDENT_AMBULATORY_CARE_PROVIDER_SITE_OTHER): Payer: Self-pay | Admitting: Bariatrics

## 2018-08-03 ENCOUNTER — Encounter (INDEPENDENT_AMBULATORY_CARE_PROVIDER_SITE_OTHER): Payer: Self-pay

## 2018-08-03 VITALS — BP 124/72 | HR 57 | Temp 97.4°F | Ht 73.0 in | Wt 238.0 lb

## 2018-08-03 DIAGNOSIS — E559 Vitamin D deficiency, unspecified: Secondary | ICD-10-CM | POA: Diagnosis not present

## 2018-08-03 DIAGNOSIS — E669 Obesity, unspecified: Secondary | ICD-10-CM | POA: Diagnosis not present

## 2018-08-03 DIAGNOSIS — Z6831 Body mass index (BMI) 31.0-31.9, adult: Secondary | ICD-10-CM | POA: Diagnosis not present

## 2018-08-03 DIAGNOSIS — Z9189 Other specified personal risk factors, not elsewhere classified: Secondary | ICD-10-CM

## 2018-08-03 DIAGNOSIS — I10 Essential (primary) hypertension: Secondary | ICD-10-CM

## 2018-08-04 ENCOUNTER — Encounter (INDEPENDENT_AMBULATORY_CARE_PROVIDER_SITE_OTHER): Payer: Self-pay | Admitting: Bariatrics

## 2018-08-04 NOTE — Progress Notes (Signed)
Office: [phone]  /  Fax: [phone]   HPI:   Chief Complaint: OBESITY Travis Meyer is here to discuss his progress with his obesity treatment plan. He is on the keep a food journal with 450-600 calories and 35+ grams of protein at supper daily and follow the Category 3 plan and is following his eating plan approximately 30 % of the time. He states he is exercising 0 minutes 0 times per week. Travis Meyer notes minimal struggles with breakfast and lunch. He has a disruptive lifestyle. He is working on meeting calories and protein goals using [employer].  His weight is 238 lb (108 kg) today and has had a weight loss of 5 pounds over a period of 2 weeks since his last visit. He has lost 5 lbs since starting treatment with [place].  Vitamin D Deficiency Travis Meyer has a diagnosis of vitamin D deficiency. He is currently taking prescription Vit D. He denies fatigue, nausea, vomiting or muscle weakness.  Hypertension Travis Meyer is a 62 y.o. male with hypertension. Travis Meyer's blood pressure is well controlled. He denies symptoms of hypotension or lightheadedness. He is working weight loss to help control his blood pressure with the goal of decreasing his risk of heart attack and stroke.   At risk for cardiovascular disease Travis Meyer is at a higher than average risk for cardiovascular disease due to obesity and hypertension. He currently denies any chest pain.  ALLERGIES: No Known Allergies  MEDICATIONS: Current Outpatient Medications on File Prior to Visit  Medication Sig Dispense Refill  . amLODipine (NORVASC) 2.5 MG tablet Take 1 tablet (2.5 mg total) by mouth as directed. 30 tablet 0  . Multiple Vitamin (MULTIVITAMIN) tablet Take 1 tablet by mouth daily.    . Omega-3 Fatty Acids (FISH OIL) 1000 MG CAPS Take by mouth daily.    . vitamin C (ASCORBIC ACID) 500 MG tablet Take 500 mg by mouth daily.    . Vitamin D, Ergocalciferol, (DRISDOL) 50000 units CAPS capsule Take 1 capsule (50,000 Units total) by mouth every  7 (seven) days. 4 capsule 0  . vitamin E 100 UNIT capsule Take by mouth daily.    [name] zinc sulfate 220 (50 Zn) MG capsule Take 220 mg by mouth daily.     No current facility-administered medications on file prior to visit.     PAST MEDICAL HISTORY: Past Medical History:  Diagnosis Date  . Dizziness   . Frequent urination   . Hypertension     PAST SURGICAL HISTORY: Past Surgical History:  Procedure Laterality Date  . HERNIA REPAIR      SOCIAL HISTORY: Social History   Tobacco Use  . Smoking status: Never Smoker  . Smokeless tobacco: Never Used  Substance Use Topics  . Alcohol use: Yes    Comment: occassional  . Drug use: Never    FAMILY HISTORY: Family History  Problem Relation Age of Onset  . Stroke Mother   . Hypertension Mother   . Heart failure Father   . Heart disease Father   . Sudden death Father   . Alcoholism Father   . Obesity Father     ROS: Review of Systems  Constitutional: Positive for weight loss. Negative for malaise/fatigue.  Cardiovascular: Negative for chest pain.  Gastrointestinal: Negative for nausea and vomiting.  Musculoskeletal:       Negative muscle weakness  Neurological:       Negative lightheadedness    PHYSICAL EXAM: Blood pressure 124/72, pulse (!) 57, temperature (!) 97.4 F (  36.3 C), temperature source Oral, height 6\' 1"  (1.854 m), weight 238 lb (108 kg), SpO2 96 %. Body mass index is 31.4 kg/m. Physical Exam  Constitutional: He is oriented to person, place, and time. He appears well-developed and well-nourished.  Cardiovascular: Normal rate.  Pulmonary/Chest: Effort normal.  Musculoskeletal: Normal range of motion.  Neurological: He is oriented to person, place, and time.  Skin: Skin is warm and dry.  Psychiatric: He has a normal mood and affect. His behavior is normal.  Vitals reviewed.   RECENT LABS AND TESTS: BMET    Component Value Date/Time   NA 139 04/05/2018 1118   K 4.2 04/05/2018 1118   CL 102  04/05/2018 1118   CO2 22 04/05/2018 1118   GLUCOSE 97 04/05/2018 1118   GLUCOSE 97 05/26/2016 1630   BUN 14 04/05/2018 1118   CREATININE 1.07 04/05/2018 1118   CALCIUM 8.9 04/05/2018 1118   GFRNONAA 74 04/05/2018 1118   GFRAA 86 04/05/2018 1118   Lab Results  Component Value Date   HGBA1C 5.4 04/05/2018   Lab Results  Component Value Date   INSULIN 9.7 04/05/2018   CBC    Component Value Date/Time   WBC 6.5 04/05/2018 1118   RBC 5.24 04/05/2018 1118   HGB 15.6 04/05/2018 1118   HCT 47.7 04/05/2018 1118   MCV 91 04/05/2018 1118   MCH 29.8 04/05/2018 1118   MCHC 32.7 04/05/2018 1118   RDW 13.3 04/05/2018 1118   LYMPHSABS 1.3 04/05/2018 1118   EOSABS 0.1 04/05/2018 1118   BASOSABS 0.1 04/05/2018 1118   Iron/TIBC/Ferritin/ %Sat No results found for: IRON, TIBC, FERRITIN, IRONPCTSAT Lipid Panel     Component Value Date/Time   CHOL 176 04/05/2018 1118   TRIG 95 04/05/2018 1118   HDL 53 04/05/2018 1118   LDLCALC 104 (H) 04/05/2018 1118   Hepatic Function Panel     Component Value Date/Time   PROT 6.4 04/05/2018 1118   ALBUMIN 4.3 04/05/2018 1118   AST 21 04/05/2018 1118   ALT 12 04/05/2018 1118   ALKPHOS 65 04/05/2018 1118   BILITOT 0.4 04/05/2018 1118      Component Value Date/Time   TSH 1.140 04/05/2018 1118  Results for Haraway, Travis Meyer (MRN [id number]) as of 08/04/2018 09:37  Ref. Range 04/05/2018 11:18  Vitamin D, 25-Hydroxy Latest Ref Range: 30.0 - 100.0 ng/mL 36.9    ASSESSMENT AND PLAN: Vitamin D deficiency  Essential hypertension  At risk for heart disease  Class 1 obesity with serious comorbidity and body mass index (BMI) of 31.0 to 31.9 in adult, unspecified obesity type  PLAN:  Vitamin D Deficiency Travis Meyer was informed that low vitamin D levels contributes to fatigue and are associated with obesity, breast, and colon cancer. Travis Meyer agrees to continue taking prescription Vit D @50 ,000 IU every week and continue Vit D rich foods. He will follow up for  routine testing of vitamin D, at least 2-3 times per year. He was informed of the risk of over-replacement of vitamin D and agrees to not increase his dose unless he discusses this with [place] first. Travis Meyer agrees to follow up with our clinic in 2 weeks with Dr. Rinaldo Ratel, and we will recheck labs at that time.  Hypertension We discussed sodium restriction, working on healthy weight loss, and a regular exercise program as the means to achieve improved blood pressure control. Travis Meyer agreed with this plan and agreed to follow up as directed. We will continue to monitor his blood pressure as well  as his progress with the above lifestyle modifications. Travis Meyer agrees to continue taking Norvasc 2.5 mg 1 tablet daily, we discussed discontinuing in the future if he becomes hypotensive. He will watch for signs of hypotension as he continues his lifestyle modifications. Travis Meyer agrees to follow up with our clinic in 2 weeks with Dr. Rinaldo Ratel, and we will recheck labs at that time.  Cardiovascular risk counselling Travis Meyer was given extended (15 minutes) coronary artery disease prevention counseling today. He is 62 y.o. male and has risk factors for heart disease including obesity and hypertension. We discussed intensive lifestyle modifications today with an emphasis on specific weight loss instructions and strategies. Pt was also informed of the importance of increasing exercise and decreasing saturated fats to help prevent heart disease.  Obesity Travis Meyer is currently in the action stage of change. As such, his goal is to continue with weight loss efforts He has agreed to keep a food journal with 450-600 calories and 35+ grams of protein at supper daily and follow the Category 3 plan Travis Meyer has been instructed to work up to a goal of 150 minutes of combined cardio and strengthening exercise per week or walking on the treadmill and resistance bands exercises every other day for weight loss and overall health benefits. We discussed the  following Behavioral Modification Strategies today: increasing lean protein intake, decreasing simple carbohydrates, decreasing sodium intake, increase H20 intake, and no skipping meals. We discussed additional protein options and handouts were given. Reiterated 10-1 ratio for protein.  Travis Meyer has agreed to follow up with our clinic in 2 weeks with Dr. Rinaldo Ratel. He was informed of the importance of frequent follow up visits to maximize his success with intensive lifestyle modifications for his multiple health conditions.   OBESITY BEHAVIORAL INTERVENTION VISIT  Today's visit was # 8   Starting weight: 243 lbs Starting date: 04/05/18 Today's weight : 238 lbs Today's date: 08/03/2018 Total lbs lost to date: 5    ASK: We discussed the diagnosis of obesity with Travis Meyer today and Travis Meyer agreed to give [place] permission to discuss obesity behavioral modification therapy today.  ASSESS: Travis Meyer has the diagnosis of obesity and his BMI today is 31.41 Travis Meyer is in the action stage of change   ADVISE: Travis Meyer was educated on the multiple health risks of obesity as well as the benefit of weight loss to improve his health. He was advised of the need for long term treatment and the importance of lifestyle modifications to improve his current health and to decrease his risk of future health problems.  AGREE: Multiple dietary modification options and treatment options were discussed and  Charlie agreed to follow the recommendations documented in the above note.  ARRANGE: Taquan was educated on the importance of frequent visits to treat obesity as outlined per CMS and USPSTF guidelines and agreed to schedule his next follow up appointment today.  Trude Mcburney, am acting as transcriptionist for [employer], DO  I have reviewed the above documentation for accuracy and completeness, and I agree with the above. -Corinna Capra, DO

## 2018-08-17 ENCOUNTER — Ambulatory Visit (INDEPENDENT_AMBULATORY_CARE_PROVIDER_SITE_OTHER): Payer: 59 | Admitting: Family Medicine

## 2018-08-17 VITALS — BP 114/69 | HR 63 | Temp 97.4°F | Ht 73.0 in | Wt 238.0 lb

## 2018-08-17 DIAGNOSIS — I10 Essential (primary) hypertension: Secondary | ICD-10-CM | POA: Diagnosis not present

## 2018-08-17 DIAGNOSIS — Z9189 Other specified personal risk factors, not elsewhere classified: Secondary | ICD-10-CM | POA: Diagnosis not present

## 2018-08-17 DIAGNOSIS — Z6831 Body mass index (BMI) 31.0-31.9, adult: Secondary | ICD-10-CM | POA: Diagnosis not present

## 2018-08-17 DIAGNOSIS — E559 Vitamin D deficiency, unspecified: Secondary | ICD-10-CM

## 2018-08-17 DIAGNOSIS — E669 Obesity, unspecified: Secondary | ICD-10-CM

## 2018-08-17 MED ORDER — VITAMIN D (ERGOCALCIFEROL) 1.25 MG (50000 UNIT) PO CAPS: 50000.0000 [IU] | ORAL_CAPSULE | ORAL | 0 refills | Status: AC

## 2018-08-18 LAB — LIPID PANEL WITH LDL/HDL RATIO
CHOLESTEROL TOTAL: 225 mg/dL — AB (ref 100–199)
HDL: 61 mg/dL (ref >39)
LDL Calculated: 139 mg/dL — ABNORMAL HIGH (ref 0–99)
LDl/HDL Ratio: 2.3 ratio (ref 0.0–3.6)
Triglycerides: 124 mg/dL (ref 0–149)
VLDL CHOLESTEROL CAL: 25 mg/dL (ref 5–40)

## 2018-08-18 LAB — INSULIN, RANDOM: INSULIN: 5.9 u[IU]/mL (ref 2.6–24.9)

## 2018-08-18 LAB — HEMOGLOBIN A1C
Est. average glucose Bld gHb Est-mCnc: 114 mg/dL
HEMOGLOBIN A1C: 5.6 % (ref 4.8–5.6)

## 2018-08-18 LAB — VITAMIN D 25 HYDROXY (VIT D DEFICIENCY, FRACTURES): VIT D 25 HYDROXY: 41.9 ng/mL (ref 30.0–100.0)

## 2018-08-18 NOTE — Progress Notes (Signed)
Office: [phone]  /  Fax: [phone]   HPI:   Chief Complaint: OBESITY Travis Meyer is here to discuss his progress with his obesity treatment plan. He is on the keep a food journal with 450-600 calories and 35+ grams of protein at supper daily and follow the Category 3 plan and is following his eating plan approximately 90 % of the time. He states he is exercising 0 minutes 0 times per week. Travis Meyer is slowly getting back into routine since his wife's accident, where she dislocated her elbow. He is not getting all meat in at dinner, probably getting in about half.  His weight is 238 lb (108 kg) today and has not lost weight since his last visit. He has lost 5 lbs since starting treatment with [place].  Vitamin D Deficiency Travis Meyer has a diagnosis of vitamin D deficiency. He is currently taking prescription Vit D. He notes fatigue and denies nausea, vomiting or muscle weakness.  At risk for osteopenia and osteoporosis Travis Meyer is at higher risk of osteopenia and osteoporosis due to vitamin D deficiency.   Hypertension Travis Meyer is a 62 y.o. male with hypertension. Travis Meyer blood pressure is well controlled today. He denies chest pain, chest pressure, or headache. He is working weight loss to help control his blood pressure with the goal of decreasing his risk of heart attack and stroke.  ALLERGIES: No Known Allergies  MEDICATIONS: Current Outpatient Medications on File Prior to Visit  Medication Sig Dispense Refill  . amLODipine (NORVASC) 2.5 MG tablet Take 1 tablet (2.5 mg total) by mouth as directed. 30 tablet 0  . Multiple Vitamin (MULTIVITAMIN) tablet Take 1 tablet by mouth daily.    . Omega-3 Fatty Acids (FISH OIL) 1000 MG CAPS Take by mouth daily.    . vitamin C (ASCORBIC ACID) 500 MG tablet Take 500 mg by mouth daily.    . vitamin E 100 UNIT capsule Take by mouth daily.    [name] zinc sulfate 220 (50 Zn) MG capsule Take 220 mg by mouth daily.     No current facility-administered medications on  file prior to visit.     PAST MEDICAL HISTORY: Past Medical History:  Diagnosis Date  . Dizziness   . Frequent urination   . Hypertension     PAST SURGICAL HISTORY: Past Surgical History:  Procedure Laterality Date  . HERNIA REPAIR      SOCIAL HISTORY: Social History   Tobacco Use  . Smoking status: Never Smoker  . Smokeless tobacco: Never Used  Substance Use Topics  . Alcohol use: Yes    Comment: occassional  . Drug use: Never    FAMILY HISTORY: Family History  Problem Relation Age of Onset  . Stroke Mother   . Hypertension Mother   . Heart failure Father   . Heart disease Father   . Sudden death Father   . Alcoholism Father   . Obesity Father     ROS: Review of Systems  Constitutional: Positive for malaise/fatigue. Negative for weight loss.  Cardiovascular: Negative for chest pain.       Negative chest pressure  Gastrointestinal: Negative for nausea and vomiting.  Musculoskeletal:       Negative muscle weakness  Neurological: Negative for headaches.    PHYSICAL EXAM: Blood pressure 114/69, pulse 63, temperature (!) 97.4 F (36.3 C), temperature source Oral, height 6\' 1"  (1.854 m), weight 238 lb (108 kg), SpO2 96 %. Body mass index is 31.4 kg/m. Physical Exam  Constitutional: He is  oriented to person, place, and time. He appears well-developed and well-nourished.  Cardiovascular: Normal rate.  Pulmonary/Chest: Effort normal.  Musculoskeletal: Normal range of motion.  Neurological: He is oriented to person, place, and time.  Skin: Skin is warm and dry.  Psychiatric: He has a normal mood and affect. His behavior is normal.  Vitals reviewed.   RECENT LABS AND TESTS: BMET    Component Value Date/Time   NA 139 04/05/2018 1118   K 4.2 04/05/2018 1118   CL 102 04/05/2018 1118   CO2 22 04/05/2018 1118   GLUCOSE 97 04/05/2018 1118   GLUCOSE 97 05/26/2016 1630   BUN 14 04/05/2018 1118   CREATININE 1.07 04/05/2018 1118   CALCIUM 8.9 04/05/2018  1118   GFRNONAA 74 04/05/2018 1118   GFRAA 86 04/05/2018 1118   Lab Results  Component Value Date   HGBA1C 5.6 08/17/2018   HGBA1C 5.4 04/05/2018   Lab Results  Component Value Date   INSULIN 5.9 08/17/2018   INSULIN 9.7 04/05/2018   CBC    Component Value Date/Time   WBC 6.5 04/05/2018 1118   RBC 5.24 04/05/2018 1118   HGB 15.6 04/05/2018 1118   HCT 47.7 04/05/2018 1118   MCV 91 04/05/2018 1118   MCH 29.8 04/05/2018 1118   MCHC 32.7 04/05/2018 1118   RDW 13.3 04/05/2018 1118   LYMPHSABS 1.3 04/05/2018 1118   EOSABS 0.1 04/05/2018 1118   BASOSABS 0.1 04/05/2018 1118   Iron/TIBC/Ferritin/ %Sat No results found for: IRON, TIBC, FERRITIN, IRONPCTSAT Lipid Panel     Component Value Date/Time   CHOL 225 (H) 08/17/2018 0839   TRIG 124 08/17/2018 0839   HDL 61 08/17/2018 0839   LDLCALC 139 (H) 08/17/2018 0839   Hepatic Function Panel     Component Value Date/Time   PROT 6.4 04/05/2018 1118   ALBUMIN 4.3 04/05/2018 1118   AST 21 04/05/2018 1118   ALT 12 04/05/2018 1118   ALKPHOS 65 04/05/2018 1118   BILITOT 0.4 04/05/2018 1118      Component Value Date/Time   TSH 1.140 04/05/2018 1118  Results for Meyer, Travis L (MRN [id number]) as of 08/18/2018 10:00  Ref. Range 08/17/2018 08:39  Vitamin D, 25-Hydroxy Latest Ref Range: 30.0 - 100.0 ng/mL 41.9    ASSESSMENT AND PLAN: Vitamin D deficiency - Plan: VITAMIN D 25 Hydroxy (Vit-D Deficiency, Fractures), Vitamin D, Ergocalciferol, (DRISDOL) 50000 units CAPS capsule  Essential hypertension - Plan: Hemoglobin A1c, Insulin, random, Lipid Panel With LDL/HDL Ratio  At risk for osteoporosis  Class 1 obesity with serious comorbidity and body mass index (BMI) of 31.0 to 31.9 in adult, unspecified obesity type  PLAN:  Vitamin D Deficiency Travis Meyer was informed that low vitamin D levels contributes to fatigue and are associated with obesity, breast, and colon cancer. [name] agrees to continue taking prescription Vit D @50 ,000 IU  every week #4 and we will refill for 1 month. He will follow up for routine testing of vitamin D, at least 2-3 times per year. He was informed of the risk of over-replacement of vitamin D and agrees to not increase his dose unless he discusses this with [place] first. We will check labs today and Travis Meyer agrees to follow up with our clinic in 2 weeks.  At risk for osteopenia and osteoporosis Travis Meyer was given extended  (15 minutes) osteoporosis prevention counseling today. Donovyn is at risk for osteopenia and osteoporsis due to his vitamin D deficiency. He was encouraged to take his vitamin D and follow  his higher calcium diet and increase strengthening exercise to help strengthen his bones and decrease his risk of osteopenia and osteoporosis.  Hypertension We discussed sodium restriction, working on healthy weight loss, and a regular exercise program as the means to achieve improved blood pressure control. Travis Meyer agreed with this plan and agreed to follow up as directed. We will continue to monitor his blood pressure as well as his progress with the above lifestyle modifications. Travis Meyer agrees to continue taking amlodipine and will watch for signs of hypotension as he continues his lifestyle modifications. We will check labs today and Travis Meyer agrees to follow up with our clinic in 2 weeks.  Obesity Travis Meyer is currently in the action stage of change. As such, his goal is to continue with weight loss efforts He has agreed to follow the Category 3 plan or follow our protein rich vegetarian plan + 300 calories Travis Meyer has been instructed to work up to a goal of 150 minutes of combined cardio and strengthening exercise per week or start exercising 2-3 times per week for weight loss and overall health benefits. We discussed the following Behavioral Modification Strategies today: increasing lean protein intake, increasing vegetables, work on meal planning and easy cooking plans, and planning for success   Travis Meyer has agreed to follow  up with our clinic in 2 weeks. He was informed of the importance of frequent follow up visits to maximize his success with intensive lifestyle modifications for his multiple health conditions.   OBESITY BEHAVIORAL INTERVENTION VISIT  Today's visit was # 9   Starting weight: 243 lbs Starting date: 04/05/18 Today's weight : 238 lbs  Today's date: 08/17/2018 Total lbs lost to date: 5    ASK: We discussed the diagnosis of obesity with Fredia BeetsBoyd L Bressi today and Travis Meyer agreed to give us permission to discuss obesity behavioral modification therapy today.  ASSESS: Travis Meyer has the diagnosis of obesity and his BMI today is 31.41 Travis Meyer is in the action stage of change   ADVISE: Travis Meyer was educated on the multiple health risks of obesity as well as the benefit of weight loss to improve his health. He was advised of the need for long term treatment and the importance of lifestyle modifications to improve his current health and to decrease his risk of future health problems.  AGREE: Multiple dietary modification options and treatment options were discussed and  Travis Meyer agreed to follow the recommendations documented in the above note.  ARRANGE: Travis Meyer was educated on the importance of frequent visits to treat obesity as outlined per CMS and USPSTF guidelines and agreed to schedule his next follow up appointment today.  I, Burt KnackSharon Martin, am acting as transcriptionist for Debbra RidingAlexandria Kadolph, MD  I have reviewed the above documentation for accuracy and completeness, and I agree with the above. - Debbra RidingAlexandria Kadolph, MD

## 2018-08-25 MED FILL — VIT D2 1.25 MG (50,000 UNIT: 1.25 MG | 28 days supply | Qty: 4 | Fill #0

## 2018-09-01 ENCOUNTER — Ambulatory Visit (INDEPENDENT_AMBULATORY_CARE_PROVIDER_SITE_OTHER): Payer: 59 | Admitting: Family Medicine

## 2018-09-01 VITALS — BP 121/80 | HR 65 | Temp 97.5°F | Ht 73.0 in | Wt 242.0 lb

## 2018-09-01 DIAGNOSIS — E669 Obesity, unspecified: Secondary | ICD-10-CM

## 2018-09-01 DIAGNOSIS — I10 Essential (primary) hypertension: Secondary | ICD-10-CM | POA: Diagnosis not present

## 2018-09-01 DIAGNOSIS — E8881 Metabolic syndrome: Secondary | ICD-10-CM | POA: Diagnosis not present

## 2018-09-01 DIAGNOSIS — Z6832 Body mass index (BMI) 32.0-32.9, adult: Secondary | ICD-10-CM | POA: Diagnosis not present

## 2018-09-01 DIAGNOSIS — E785 Hyperlipidemia, unspecified: Secondary | ICD-10-CM | POA: Diagnosis not present

## 2018-09-02 NOTE — Progress Notes (Signed)
Office: [phone]  /  Fax: [phone]   HPI:   Chief Complaint: OBESITY Travis Meyer is here to discuss his progress with his obesity treatment plan. He is on the Category 3 plan or follow our protein rich vegetarian plan + 300 calories and is following his eating plan approximately 80 % of the time. He states he is exercising 0 minutes 0 times per week. [name] just recently got two puppies, he is not really focused and feeling like his routine is off track. He notes breakfast and lunch are ok but dinner is more difficult.  His weight is 242 lb (109.8 kg) today and has gained 4 pounds since his last visit. He has lost 1 lb since starting treatment with [place].  Hyperlipidemia Travis Meyer has hyperlipidemia and has been trying to improve his cholesterol levels with intensive lifestyle modification including a low saturated fat diet, exercise and weight loss. Elevated LDL (increased from previous). He denies any chest pain, claudication or myalgias.  Hypertension Travis Meyer is a 62 y.o. male with hypertension. [name]'s blood pressure is controlled today. He denies chest pain, chest pressure, or headache. He is working weight loss to help control his blood pressure with the goal of decreasing his risk of heart attack and stroke.   Insulin Resistance Travis Meyer has a diagnosis of insulin resistance based on his elevated fasting insulin level >5. Although Travis Meyer's blood glucose readings are still under good control, insulin resistance puts him at greater risk of metabolic syndrome and diabetes. He notes crabohydrate cravings. He is not taking metformin currently and continues to work on diet and exercise to decrease risk of diabetes.  ALLERGIES: No Known Allergies  MEDICATIONS: Current Outpatient Medications on File Prior to Visit  Medication Sig Dispense Refill  . amLODipine (NORVASC) 2.5 MG tablet Take 1 tablet (2.5 mg total) by mouth as directed. 30 tablet 0  . Multiple Vitamin (MULTIVITAMIN) tablet Take 1  tablet by mouth daily.    . Omega-3 Fatty Acids (FISH OIL) 1000 MG CAPS Take by mouth daily.    . vitamin C (ASCORBIC ACID) 500 MG tablet Take 500 mg by mouth daily.    . Vitamin D, Ergocalciferol, (DRISDOL) 50000 units CAPS capsule Take 1 capsule (50,000 Units total) by mouth every 7 (seven) days. 4 capsule 0  . vitamin E 100 UNIT capsule Take by mouth daily.    [name] zinc sulfate 220 (50 Zn) MG capsule Take 220 mg by mouth daily.     No current facility-administered medications on file prior to visit.     PAST MEDICAL HISTORY: Past Medical History:  Diagnosis Date  . Dizziness   . Frequent urination   . Hypertension     PAST SURGICAL HISTORY: Past Surgical History:  Procedure Laterality Date  . HERNIA REPAIR      SOCIAL HISTORY: Social History   Tobacco Use  . Smoking status: Never Smoker  . Smokeless tobacco: Never Used  Substance Use Topics  . Alcohol use: Yes    Comment: occassional  . Drug use: Never    FAMILY HISTORY: Family History  Problem Relation Age of Onset  . Stroke Mother   . Hypertension Mother   . Heart failure Father   . Heart disease Father   . Sudden death Father   . Alcoholism Father   . Obesity Father     ROS: Review of Systems  Constitutional: Negative for weight loss.  Cardiovascular: Negative for chest pain and claudication.  Negative chest pressure  Musculoskeletal: Negative for myalgias.  Neurological: Negative for headaches.    PHYSICAL EXAM: Blood pressure 121/80, pulse 65, temperature (!) 97.5 F (36.4 C), temperature source Oral, height 6\' 1"  (1.854 m), weight 242 lb (109.8 kg), SpO2 97 %. Body mass index is 31.93 kg/m. Physical Exam  Constitutional: He is oriented to person, place, and time. He appears well-developed and well-nourished.  Cardiovascular: Normal rate.  Pulmonary/Chest: Effort normal.  Musculoskeletal: Normal range of motion.  Neurological: He is oriented to person, place, and time.  Skin: Skin is warm  and dry.  Psychiatric: He has a normal mood and affect. His behavior is normal.  Vitals reviewed.   RECENT LABS AND TESTS: BMET    Component Value Date/Time   NA 139 04/05/2018 1118   K 4.2 04/05/2018 1118   CL 102 04/05/2018 1118   CO2 22 04/05/2018 1118   GLUCOSE 97 04/05/2018 1118   GLUCOSE 97 05/26/2016 1630   BUN 14 04/05/2018 1118   CREATININE 1.07 04/05/2018 1118   CALCIUM 8.9 04/05/2018 1118   GFRNONAA 74 04/05/2018 1118   GFRAA 86 04/05/2018 1118   Lab Results  Component Value Date   HGBA1C 5.6 08/17/2018   HGBA1C 5.4 04/05/2018   Lab Results  Component Value Date   INSULIN 5.9 08/17/2018   INSULIN 9.7 04/05/2018   CBC    Component Value Date/Time   WBC 6.5 04/05/2018 1118   RBC 5.24 04/05/2018 1118   HGB 15.6 04/05/2018 1118   HCT 47.7 04/05/2018 1118   MCV 91 04/05/2018 1118   MCH 29.8 04/05/2018 1118   MCHC 32.7 04/05/2018 1118   RDW 13.3 04/05/2018 1118   LYMPHSABS 1.3 04/05/2018 1118   EOSABS 0.1 04/05/2018 1118   BASOSABS 0.1 04/05/2018 1118   Iron/TIBC/Ferritin/ %Sat No results found for: IRON, TIBC, FERRITIN, IRONPCTSAT Lipid Panel     Component Value Date/Time   CHOL 225 (H) 08/17/2018 0839   TRIG 124 08/17/2018 0839   HDL 61 08/17/2018 0839   LDLCALC 139 (H) 08/17/2018 0839   Hepatic Function Panel     Component Value Date/Time   PROT 6.4 04/05/2018 1118   ALBUMIN 4.3 04/05/2018 1118   AST 21 04/05/2018 1118   ALT 12 04/05/2018 1118   ALKPHOS 65 04/05/2018 1118   BILITOT 0.4 04/05/2018 1118      Component Value Date/Time   TSH 1.140 04/05/2018 1118    ASSESSMENT AND PLAN: Hyperlipidemia, unspecified hyperlipidemia type  Essential hypertension  Insulin resistance  Class 1 obesity with serious comorbidity and body mass index (BMI) of 32.0 to 32.9 in adult, unspecified obesity type  PLAN:  Hyperlipidemia Calyn was informed of the American Heart Association Guidelines emphasizing intensive lifestyle modifications as the  first line treatment for hyperlipidemia. We discussed many lifestyle modifications today in depth, and Unknown will continue to work on decreasing saturated fats such as fatty red meat, butter and many fried foods. He will also increase vegetables and lean protein in his diet and continue to work on exercise and weight loss efforts. We will repeat labs in the next 3 months. Li agrees to follow up with our clinic as needed.  Hypertension We discussed sodium restriction, working on healthy weight loss, and a regular exercise program as the means to achieve improved blood pressure control. Leavy Cella agreed with this plan and agreed to follow up as directed. We will continue to monitor his blood pressure as well as his progress with the above lifestyle modifications.  He will continue his medications as prescribed and will watch for signs of hypotension as he continues his lifestyle modifications. Luz is to follow up with his primary care physician at next appointment. Navjot agrees to follow up with our clinic as needed.  Insulin Resistance Dayquan will continue to work on weight loss, exercise, and decreasing simple carbohydrates in his diet to help decrease the risk of diabetes. We dicussed metformin including benefits and risks. He was informed that eating too many simple carbohydrates or too many calories at one sitting increases the likelihood of GI side effects. Kayson declined metformin for now and prescription was not written today. We will re-evaluate with he returns to the clinic. Reginal agrees to follow up with our clinic as needed as directed to monitor his progress.  I spent > than 50% of the 15 minute visit on counseling as documented in the note.  Obesity Landon is currently in the action stage of change. As such, his goal is to continue with weight loss efforts He has agreed to portion control better and make smarter food choices, such as increase vegetables and decrease simple carbohydrates  Jackob has been  instructed to work up to a goal of 150 minutes of combined cardio and strengthening exercise per week for weight loss and overall health benefits. We discussed the following Behavioral Modification Strategies today: increasing lean protein intake, increasing vegetables, work on meal planning and easy cooking plans, and planning for success   Duanne has agreed to follow up with our clinic as needed. He was informed of the importance of frequent follow up visits to maximize his success with intensive lifestyle modifications for his multiple health conditions.   OBESITY BEHAVIORAL INTERVENTION VISIT  Today's visit was # 10   Starting weight: 243 lbs Starting date: 04/05/18 Today's weight : 242 lbs  Today's date: 09/01/2018 Total lbs lost to date: 1    ASK: We discussed the diagnosis of obesity with [name] today and Ziare agreed to give [place] permission to discuss obesity behavioral modification therapy today.  ASSESS: Cadyn has the diagnosis of obesity and his BMI today is 31.93 Maikol is in the action stage of change   ADVISE: Jaxn was educated on the multiple health risks of obesity as well as the benefit of weight loss to improve his health. He was advised of the need for long term treatment and the importance of lifestyle modifications to improve his current health and to decrease his risk of future health problems.  AGREE: Multiple dietary modification options and treatment options were discussed and  Pradeep agreed to follow the recommendations documented in the above note.  ARRANGE: Breckin was educated on the importance of frequent visits to treat obesity as outlined per CMS and USPSTF guidelines and agreed to schedule his next follow up appointment today.  I, Burt Knack, am acting as transcriptionist for Debbra Riding, MD  I have reviewed the above documentation for accuracy and completeness, and I agree with the above. - Debbra Riding, MD

## 2018-09-15 DIAGNOSIS — Z0000 Encounter for general adult medical examination without abnormal findings: Secondary | ICD-10-CM | POA: Diagnosis not present

## 2018-09-15 DIAGNOSIS — I10 Essential (primary) hypertension: Secondary | ICD-10-CM | POA: Diagnosis not present

## 2018-09-15 DIAGNOSIS — E7800 Pure hypercholesterolemia, unspecified: Secondary | ICD-10-CM | POA: Diagnosis not present

## 2018-09-15 DIAGNOSIS — Z1159 Encounter for screening for other viral diseases: Secondary | ICD-10-CM | POA: Diagnosis not present

## 2018-09-15 DIAGNOSIS — Z125 Encounter for screening for malignant neoplasm of prostate: Secondary | ICD-10-CM | POA: Diagnosis not present

## 2018-12-31 ENCOUNTER — Encounter: Payer: 59 | Attending: Family Medicine | Admitting: Dietician

## 2018-12-31 DIAGNOSIS — Z789 Other specified health status: Secondary | ICD-10-CM | POA: Insufficient documentation

## 2018-12-31 DIAGNOSIS — Z713 Dietary counseling and surveillance: Secondary | ICD-10-CM | POA: Insufficient documentation

## 2018-12-31 NOTE — Progress Notes (Signed)
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 Pt lives with his wife. Pt is pleasant, personable, and was very interested and engaged during today's visit. Pt seems very motivated.   24-Hr Dietary Recall First Meal: 2 eggs (scrambled or with cheese) + 1-2 pieces of toast + siggi's yogurt Snack: fruit + chocolate (or Goldfish)  Second Meal: 3 slices deli turkey/chicken + 2 slices of bread + slice cheese + lettuce  Snack: none Third Meal: chicken + rice + carrots  Snack: low sugar ice cream  Beverages: [employer] water + water + wine (occasionally)   Food & Nutrition Related Hx Dietary Hx: Pt states he likes Lindt chocolate and sweets, but tries to do the low sugar ice cream. Pt states he likes dark chocolate and cacao nibs rather than milk chocolate. Pt believes his main struggles are portion control and eating too quickly. Pt states he never feels hungry. Pt states he eats because it is "time to eat" and he finishes his plate out of habit, whether or not he is full. Pt states she is used to eating food at home for the sake of it not going bad and eating his entire plateful as to not leave any.   Estimated Daily Fluid Intake: 64+ oz Supplements: MVI, vitamin C, E, D3, K, turmeric, omega-3  GI / Other Notable Symptoms: heartburn occasionally   Physical Activity  Current average weekly physical activity: 10,000 steps/day at least 4 days/week   Estimated Energy Needs Calories: 2000 Carbohydrate: 225g Protein: 150g Fat: 55g   NUTRITION DIAGNOSIS  Excessive oral intake  (NI-2.2) related to loss of appetite awareness as evidenced by intake of large portions of foods and reports of not acknowledging feelings of hunger/fullness.    NUTRITION INTERVENTION  Nutrition education (E-1) on the following topics:  . Healthful diet: MyPlate, food groups, balanced eating, energy sources, focus on unsaturated fats, avoid added sugars, choose lean proteins  . Fiber: sources, importance, effects on heart health, weight management, fullness/satiety   . Mindful eating: chewing each bite, eating slowly, avoiding distractions while eating, letting brain "catch up" to stomach, paying attention to what and how much we are eating . Portions: smaller meals and snacks frequently, use smaller plates/bowls to eat off of, start with smaller portions then get more if still hungry  Handouts Provided Include   MyPlate  [facility] Healthy Nutrition Therapy   Types of Fat   Mindful Eating: Should I Eat?   Food Log sheets (per pt request)   Learning Style & Readiness for Change Teaching method utilized: Visual & Auditory  Demonstrated degree of understanding via: Teach Back  Barriers to learning/adherence to lifestyle change: None Identified    MONITORING & EVALUATION Dietary intake, weekly physical activity, and goals in 1 month.  RD's Notes for Next Visit  . Handouts: Meal Ideas, Weight Management Self-Monitoring Goals  . Meal and snack ideas, especially incorporating more vegetables and fiber  . Address heartburn  . Physical activity, and ideas for getting in more, continue keeping up with walking/steps   Next Steps  Patient is to return to [facility] for 2nd wellness  visit in 1 month.

## 2018-12-31 NOTE — Patient Instructions (Signed)
   Remember to CHEW your food very thoroughly, one bite at a time. Chew each bite of food to help you slow down when eating. This will help your body to realize when your stomach is full.   If you'd like, keep a log of the foods that you eat. This may help you to "see" exactly what you are eating and how much throughout the day.   Try to avoid distractions while eating and pay a little more attention to the food. This will help you enjoy your food more and realize what you're eating and when you have had enough to eat.   Pay attention to portion sizes. Start serving yourself smaller portions at a time so that your stomach is not filled up past "full." Then, you may be more ready to eat in a few hours when it is time for your next meal/snack. Use smaller plates and bowls. Ask for a to-go box at restaurants and put half of your meal in it before eating.   Remember fiber! Fiber is found in plant foods (fruits, vegetables, and whole grains) and helps fill [place] up more quickly and for longer. It is great for heart health and weight maintenance.

## 2019-02-04 ENCOUNTER — Encounter: Payer: 59 | Attending: Family Medicine | Admitting: Dietician

## 2019-02-04 VITALS — Wt 260.0 lb

## 2019-02-04 DIAGNOSIS — Z713 Dietary counseling and surveillance: Secondary | ICD-10-CM | POA: Insufficient documentation

## 2019-02-04 DIAGNOSIS — Z789 Other specified health status: Secondary | ICD-10-CM | POA: Insufficient documentation

## 2019-02-04 NOTE — Progress Notes (Signed)
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 Pt is pleasant, personable, and was very interested and engaged during today's visit. Pt seems very motivated.   24-Hr Dietary Recall First Meal: 1-2 pieces of toast w/ butter + siggi's yogurt Snack: fruit + chocolate (or Goldfish)  Second Meal: 3 slices deli turkey/chicken (or 3 slices tomato) + 2 slices of bread + slice cheese + lettuce Snack: none Third Meal: chicken + rice + carrots (or hot dog, or hamburger, or taco)  Snack: low sugar ice cream  Beverages: [employer] water + water + wine (occasionally) + juice  Food & Nutrition Related Hx Dietary Hx: Pt states he likes Lindt chocolate and sweets, but tries to do the low sugar ice cream. Pt states he likes dark chocolate and cacao nibs rather than milk chocolate. Pt believes his main struggles are portion control and eating too quickly. Pt states he never feels hungry. Pt states he eats because it is "time to eat" and he finishes his plate out of habit, whether or not he is full. Pt states she is used to eating food at home for the sake of it not going bad and eating his entire plateful as to not leave any.   Pt states he has been constipated and does not believe he is eating enough vegetables. Plans to do a juice cleanse through [employer] for 1 day this coming week with his daughter. Pt expressed his frustrations with his weight, and that 260 lbs is his highest weight which he has now returned to, and would like to weigh  230 or 240 lbs.   Estimated Daily Fluid Intake: 64+ oz Supplements: MVI, vitamin C, E, D3, K, turmeric, omega-3  GI / Other Notable Symptoms: heartburn occasionally, constipation (started taking Metamucil)    Physical Activity  Current average weekly physical activity: 10,000 steps/day at least 4 days/week   Estimated Energy Needs Calories: 2000 Carbohydrate: 225g Protein: 150g Fat: 55g   NUTRITION DIAGNOSIS  Excessive oral intake (NI-2.2) related to loss of appetite awareness as evidenced by intake of large portions of foods and reports of not acknowledging feelings of hunger/fullness.    NUTRITION INTERVENTION  Nutrition education (E-1) on the following topics:  Initial Visit (01/06/2019) . Healthful diet: MyPlate, food groups, balanced eating, energy sources, focus on unsaturated fats, avoid added sugars, choose lean proteins  . Fiber: sources, importance, effects on heart health, weight management, fullness/satiety   . Mindful eating: chewing each bite, eating slowly, avoiding distractions while eating, letting brain "catch up" to stomach, paying attention to what and how much we are eating . Portions: smaller meals and snacks frequently, use smaller plates/bowls to eat off of, start with smaller portions then get more if still hungry 2nd Visit (02/04/2019) . Fiber: sources (whole grains, vegetables, fruits, beans, etc.), increase fluid intake with increased fiber intake, try adding into breakfast (ex: oatmeal, cereal), incorporate more veggies (try raw vs steamed vs roasted, and try adding herbs, spices, nutritional yeast, cheese, etc. for flavor and variety)  . Balanced meals and snacks: variety of  food groups, pair carbohydrate foods with protein source, eating mindfully and pay attention to portion sizes, keep an eye on high saturated fat and added sugars  Handouts Provided Include   Meal Ideas   Balanced Snacks   10 Tips for Increasing Vegetable Intake   Low Sodium  Flavoring Tips  Learning Style & Readiness for Change Teaching method utilized: Visual & Auditory  Demonstrated degree of understanding via: Teach Back  Barriers to learning/adherence to lifestyle change: None Identified    MONITORING & EVALUATION Dietary intake, weekly physical activity, and goals in 1 month.  RD's Notes for Next Visit  . Handouts: Weight Management Self-Monitoring Goals  . Address heartburn   Next Steps  Patient is to return to [facility] for 3rd wellness visit in 1 month.

## 2019-02-04 NOTE — Patient Instructions (Addendum)
Goals:   Work on increasing fiber intake by eating foods such as whole grains, vegetables, and whole fruits. Be sure to increase water intake as you eat more fiber. Try whole grain sandwich thins, oatmeal, beans, etc. for more fiber.   Continue walking and getting plenty of physical activity. The goal is 150 minutes per week.   Remember to pay attention to how you feel before you eat, and try to eat when you feel hungry then stop when you're full.

## 2019-02-14 DIAGNOSIS — H524 Presbyopia: Secondary | ICD-10-CM | POA: Diagnosis not present

## 2019-02-14 DIAGNOSIS — H52221 Regular astigmatism, right eye: Secondary | ICD-10-CM | POA: Diagnosis not present

## 2019-02-14 DIAGNOSIS — H5213 Myopia, bilateral: Secondary | ICD-10-CM | POA: Diagnosis not present

## 2019-03-04 ENCOUNTER — Ambulatory Visit: Payer: 59 | Admitting: Dietician

## 2019-03-09 ENCOUNTER — Encounter: Payer: Self-pay | Admitting: Dietician

## 2019-03-09 ENCOUNTER — Encounter: Payer: 59 | Attending: Family Medicine | Admitting: Dietician

## 2019-03-09 DIAGNOSIS — Z713 Dietary counseling and surveillance: Secondary | ICD-10-CM | POA: Insufficient documentation

## 2019-03-09 DIAGNOSIS — Z789 Other specified health status: Secondary | ICD-10-CM | POA: Insufficient documentation

## 2019-03-09 DIAGNOSIS — E669 Obesity, unspecified: Secondary | ICD-10-CM | POA: Insufficient documentation

## 2019-03-09 NOTE — Patient Instructions (Addendum)
   Be careful with salty foods. These snacks are okay to enjoy, just be mindful of how much salt is added to them. If you still need something "crunchy," try no-salt added versions of foods such as nuts and popcorn. Utilize the Low Sodium Flavoring Ideas sheet from last time for ways to add flavor without using salt.    Increase vegetables by adding them into smoothies, preparing them in different ways (roasting, raw, sauteing, etc.), and mixing into recipes such as soups and casseroles. Again, use other spices/herbs/seasonings for some flavor!   To help with heartburn, try the following tips:   Exercise at least 3 to 4 times per week.   Wear loose-fitting clothes.   Avoid smoking.   Raise the head of your bed 6 to 9 inches, and/or try to wait 2 to 3 hours before laying down after you eat.   Eat small meals/snacks throughout the day (avoid large portions or amounts at a time.)   Limit common trouble foods: mint, chocolate, alcohol, caffeine, pepper, high-fat foods (fried foods, pastries/desserts, butter/shortening, whole fat dairy, greasy foods, etc.)    To help with portion control, try the following tips:   Use smaller plates/bowls to eat off of.   Serve yourself smaller portions at the beginning of the meal. Then, go back and get more if you are still hungry.   Chew each bite thoroughly. It might help to count the number of chews (for example, aim for 30 chews per bite) before swallowing to help get you in the habit of chewing a lot.   Use your non-dominant hand to eat with, this could help to slow you down.   Remember, it takes the brain about 15 minutes to realize there is food in the stomach. Slowing down can help our brain "catch up" before we eat past fullness.

## 2019-03-09 NOTE — Progress Notes (Signed)
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 Pt lives with his wife. Pt is pleasant, personable, and was very interested and engaged during his visits. Pt seems very motivated.   24-Hr Dietary Recall First Meal: 1-2 pieces of toast w/ butter + siggi's yogurt (or Cheerios cereal + milk)  Snack: fruit + 80% dark chocolate (or Goldfish)  Second Meal: 3 slices deli turkey/chicken (or 3 slices tomato) + 2 slices of whole wheat bread + slice cheese + lettuce Snack: none Third Meal: chicken + rice + carrots (or hot dog, or hamburger, or taco)  Snack: low sugar ice cream  Beverages: [employer] water + water + wine (occasionally) + juice + Bragg's vinegar drink and dandelion detox tea  Food & Nutrition Related Hx Dietary Hx: Pt states he likes Lindt chocolate and sweets, but tries to do the low sugar ice cream. Pt states he likes dark chocolate and cacao nibs rather than milk chocolate. Pt believes his main struggles are portion control and eating too quickly. Pt states he never feels hungry. Pt states he eats because it is "time to eat" and he finishes his plate out of habit, whether or not he is full. Pt states she is used to eating food at home for the sake of it not going bad and eating his entire plateful as to not leave any.   Pt states he has been constipated and does not believe he is eating enough vegetables. Plans to do a juice cleanse through [employer] for 1 day this coming week with his daughter. Pt expressed his frustrations with his weight, and that 260 lbs  is his highest weight which he has now returned to, and would like to weigh 230 or 240 lbs.   Pt dislikes vegetables but likes fruits.  Pt has been drinking Bragg's vinegar drink and dandelion detox tea. Walks before lunch and after work. Pt believes he still eats too quickly and consumes too much food at meal times. Pt states he knows he should eat more vegetables and limit his sodium intake, but dislikes vegetables and will often eat salty crunchy snacks such as chips and Goldfish. Pt states he is not as constipated but is not sure if he has regular BM's.   Estimated Daily Fluid Intake: 64+ oz Supplements: MVI, vitamin C, E, D3, K, turmeric, omega-3  GI / Other Notable Symptoms: heartburn occasionally, constipation sometimes   Physical Activity  Current average weekly physical activity: 10,000 steps/day at least 4 days/week   Estimated Energy Needs Calories: 2000 Carbohydrate: 225g Protein: 150g Fat: 55g   NUTRITION DIAGNOSIS  Excessive oral intake (NI-2.2) related to loss of appetite awareness as evidenced by intake of large portions of foods and reports of not acknowledging feelings of hunger/fullness.    NUTRITION INTERVENTION  Nutrition education (E-1) on the following topics:  Initial Visit (01/06/2019) . Healthful diet: MyPlate, food groups, balanced eating, energy sources, focus on unsaturated fats, avoid added sugars, choose lean proteins  . Fiber: sources, importance, effects on heart health, weight management, fullness/satiety   . Mindful eating: chewing each bite, eating slowly,  avoiding distractions while eating, letting brain "catch up" to stomach, paying attention to what and how much we are eating . Portions: smaller meals and snacks frequently, use smaller plates/bowls to eat off of, start with smaller portions then get more if still hungry 2nd Visit (02/04/2019) . Fiber: sources (whole grains, vegetables, fruits, beans, etc.), increase fluid intake with increased fiber  intake, try adding into breakfast (ex: oatmeal, cereal), incorporate more veggies (try raw vs steamed vs roasted, and try adding herbs, spices, nutritional yeast, cheese, etc. for flavor and variety)  . Balanced meals and snacks: variety of food groups, pair carbohydrate foods with protein source, eating mindfully and pay attention to portion sizes, keep an eye on high saturated fat and added sugars 3rd Visit (03/09/2019 - Telephone)   Vegetables: try to increase intake by preparing them in different ways, adding low-sodium seasonings (handout provided at last visit), and adding them to smoothies  Mindful eating: remember that small changes over time can develop into habits and create lasting results, discouraged fad diets, try slowing down when eating by counting out number of chews per bite, eating with non-dominant hand, etc.   Heartburn: foods to avoid (mint, chocolate, alcohol, caffeine, etc.), do not eat within 2 hours of laying down/bedtime, wear loose-fitting clothing, get plenty of physical activity  Learning Style & Readiness for Change Teaching method utilized: Visual & Auditory  Demonstrated degree of understanding via: Teach Back  Barriers to learning/adherence to lifestyle change: None Identified    MONITORING & EVALUATION Dietary intake, weekly physical activity, and goals PRN.  Next Steps  Patient has successfully completed his 3 wellness visits. Patient is to contact and/or return to [facility] in the future as needed or desired.

## 2019-05-31 MED FILL — AMLODIPINE 2.5 MG TABLET: 2.5 | 90 days supply | Qty: 90 | Fill #0

## 2019-09-13 MED FILL — AMLODIPINE 2.5 MG TABLET: 2.5 | 90 days supply | Qty: 90 | Fill #0

## 2019-10-03 DIAGNOSIS — Z125 Encounter for screening for malignant neoplasm of prostate: Secondary | ICD-10-CM | POA: Diagnosis not present

## 2019-10-03 DIAGNOSIS — I10 Essential (primary) hypertension: Secondary | ICD-10-CM | POA: Diagnosis not present

## 2019-10-03 DIAGNOSIS — R972 Elevated prostate specific antigen [PSA]: Secondary | ICD-10-CM | POA: Diagnosis not present

## 2019-10-03 DIAGNOSIS — E7800 Pure hypercholesterolemia, unspecified: Secondary | ICD-10-CM | POA: Diagnosis not present

## 2019-10-03 DIAGNOSIS — Z0000 Encounter for general adult medical examination without abnormal findings: Secondary | ICD-10-CM | POA: Diagnosis not present

## 2019-10-06 DIAGNOSIS — E7800 Pure hypercholesterolemia, unspecified: Secondary | ICD-10-CM | POA: Diagnosis not present

## 2019-10-06 DIAGNOSIS — I10 Essential (primary) hypertension: Secondary | ICD-10-CM | POA: Diagnosis not present

## 2019-10-06 DIAGNOSIS — Z125 Encounter for screening for malignant neoplasm of prostate: Secondary | ICD-10-CM | POA: Diagnosis not present

## 2019-10-06 DIAGNOSIS — Z23 Encounter for immunization: Secondary | ICD-10-CM | POA: Diagnosis not present

## 2019-10-06 DIAGNOSIS — Z0000 Encounter for general adult medical examination without abnormal findings: Secondary | ICD-10-CM | POA: Diagnosis not present

## 2019-10-17 MED FILL — ATORVASTATIN 10 MG TABLET: 10 | 90 days supply | Qty: 90 | Fill #0

## 2019-11-04 DIAGNOSIS — R972 Elevated prostate specific antigen [PSA]: Secondary | ICD-10-CM | POA: Diagnosis not present

## 2019-11-04 DIAGNOSIS — Z125 Encounter for screening for malignant neoplasm of prostate: Secondary | ICD-10-CM | POA: Diagnosis not present

## 2019-11-04 DIAGNOSIS — E7800 Pure hypercholesterolemia, unspecified: Secondary | ICD-10-CM | POA: Diagnosis not present

## 2019-11-04 DIAGNOSIS — Z0000 Encounter for general adult medical examination without abnormal findings: Secondary | ICD-10-CM | POA: Diagnosis not present

## 2019-11-04 DIAGNOSIS — I10 Essential (primary) hypertension: Secondary | ICD-10-CM | POA: Diagnosis not present

## 2020-01-16 MED FILL — AMLODIPINE 2.5 MG TABLET: 2.5 | 90 days supply | Qty: 90 | Fill #0

## 2020-05-15 DIAGNOSIS — H524 Presbyopia: Secondary | ICD-10-CM | POA: Diagnosis not present

## 2020-05-15 DIAGNOSIS — H52221 Regular astigmatism, right eye: Secondary | ICD-10-CM | POA: Diagnosis not present

## 2020-05-15 DIAGNOSIS — H2513 Age-related nuclear cataract, bilateral: Secondary | ICD-10-CM | POA: Diagnosis not present

## 2020-05-15 DIAGNOSIS — H5213 Myopia, bilateral: Secondary | ICD-10-CM | POA: Diagnosis not present

## 2020-08-01 MED FILL — AMLODIPINE BESYLATE 2.5 MG: 2.5 | 90 days supply | Qty: 90 | Fill #1

## 2023-08-19 ENCOUNTER — Other Ambulatory Visit (HOSPITAL_COMMUNITY): Payer: Self-pay
